# Patient Record
Sex: Female | Born: 1980 | Race: Black or African American | Hispanic: No | Marital: Married | Smoking: Never smoker
Health system: Southern US, Community
[De-identification: ages and names within clinical notes are randomized; demographics above are authoritative.]

## PROBLEM LIST (undated history)

## (undated) ENCOUNTER — Inpatient Hospital Stay (HOSPITAL_COMMUNITY): Payer: Self-pay

## (undated) DIAGNOSIS — D219 Benign neoplasm of connective and other soft tissue, unspecified: Secondary | ICD-10-CM

## (undated) DIAGNOSIS — N856 Intrauterine synechiae: Secondary | ICD-10-CM

## (undated) DIAGNOSIS — E785 Hyperlipidemia, unspecified: Secondary | ICD-10-CM

## (undated) DIAGNOSIS — L309 Dermatitis, unspecified: Secondary | ICD-10-CM

## (undated) DIAGNOSIS — Z973 Presence of spectacles and contact lenses: Secondary | ICD-10-CM

## (undated) DIAGNOSIS — F419 Anxiety disorder, unspecified: Secondary | ICD-10-CM

## (undated) HISTORY — DX: Hyperlipidemia, unspecified: E78.5

## (undated) HISTORY — DX: Dermatitis, unspecified: L30.9

---

## 2011-09-07 HISTORY — PX: OTHER SURGICAL HISTORY: SHX169

## 2012-08-06 LAB — HM PAP SMEAR

## 2013-02-01 ENCOUNTER — Other Ambulatory Visit (HOSPITAL_COMMUNITY): Payer: Self-pay | Admitting: Obstetrics and Gynecology

## 2013-02-01 DIAGNOSIS — Z319 Encounter for procreative management, unspecified: Secondary | ICD-10-CM

## 2013-02-09 ENCOUNTER — Ambulatory Visit (HOSPITAL_COMMUNITY)
Admission: RE | Admit: 2013-02-09 | Discharge: 2013-02-09 | Disposition: A | Discharge status: Home / Self Care | Payer: Federal, State, Local not specified - PPO | Source: Ambulatory Visit | Attending: Obstetrics and Gynecology | Admitting: Obstetrics and Gynecology

## 2013-02-09 DIAGNOSIS — Z319 Encounter for procreative management, unspecified: Secondary | ICD-10-CM

## 2013-02-09 DIAGNOSIS — N979 Female infertility, unspecified: Secondary | ICD-10-CM | POA: Insufficient documentation

## 2013-02-09 MED ORDER — IOHEXOL 300 MG/ML  SOLN
20.0000 mL | Freq: Once | INTRAMUSCULAR | Status: AC | PRN
  Administered 2013-02-09: 20 mL

## 2013-10-12 ENCOUNTER — Ambulatory Visit (INDEPENDENT_AMBULATORY_CARE_PROVIDER_SITE_OTHER): Payer: Federal, State, Local not specified - PPO | Admitting: Family Medicine

## 2013-10-12 ENCOUNTER — Encounter: Payer: Self-pay | Admitting: Family Medicine

## 2013-10-12 ENCOUNTER — Encounter (INDEPENDENT_AMBULATORY_CARE_PROVIDER_SITE_OTHER): Payer: Self-pay

## 2013-10-12 VITALS — BP 115/70 | HR 87 | Resp 16 | Ht 65.0 in | Wt 182.0 lb

## 2013-10-12 DIAGNOSIS — R5381 Other malaise: Secondary | ICD-10-CM

## 2013-10-12 DIAGNOSIS — E349 Endocrine disorder, unspecified: Secondary | ICD-10-CM

## 2013-10-12 DIAGNOSIS — R7301 Impaired fasting glucose: Secondary | ICD-10-CM

## 2013-10-12 DIAGNOSIS — R7989 Other specified abnormal findings of blood chemistry: Secondary | ICD-10-CM

## 2013-10-12 DIAGNOSIS — R5383 Other fatigue: Secondary | ICD-10-CM

## 2013-10-12 DIAGNOSIS — K219 Gastro-esophageal reflux disease without esophagitis: Secondary | ICD-10-CM

## 2013-10-12 DIAGNOSIS — E785 Hyperlipidemia, unspecified: Secondary | ICD-10-CM

## 2013-10-12 MED ORDER — PANTOPRAZOLE SODIUM 40 MG PO TBEC: 40.0000 mg | DELAYED_RELEASE_TABLET | Freq: Every day | ORAL | Status: DC

## 2013-10-12 NOTE — Progress Notes (Signed)
Subjective:    Patient ID: Nicole Wiggins, female    DOB: 11-Oct-1980, 33 y.o.   MRN: [id number]  HPI  Nicole Wiggins is here today to establish care with our practice.  She found our practice online.  She moved to [place] back in 2012 but has not had a PCP since moving here.  She would like to discuss the conditions listed below:   1)  Infertility - She and her husband have been trying to become pregnant for the past year. She is scheduled to see a fertility specialist in March.  She wonders if there are any labs that she should have before that visit.     2)  Chest Discomfort - She has been having a mild chest discomfort for the past month.  She notices this problem usually when she eats.  She has not tried any OTC medications for this symptom.    3)  Hyperlipidemia:  She has been told by other providers that she has elevated cholesterol levels.  She has tried improving her diet and exercising and would like to have lab-work done.    Review of Systems  Constitutional: Negative for activity change, fatigue and unexpected weight change.  HENT: Negative.   Eyes: Negative.   Respiratory: Negative for shortness of breath.   Cardiovascular: Positive for chest pain. Negative for palpitations and leg swelling.  Gastrointestinal: Negative for diarrhea and constipation.  Endocrine: Negative.   Genitourinary: Negative for difficulty urinating.  Musculoskeletal: Negative.   Skin: Negative.   Neurological: Negative.   Hematological: Negative for adenopathy. Does not bruise/bleed easily.  Psychiatric/Behavioral: Negative for sleep disturbance and dysphoric mood. The patient is not nervous/anxious.      Past Medical History  Diagnosis Date  . Fibroids, submucosal   . Eczema     Neck, behind ears and scalp     Past Surgical History  Procedure Laterality Date  . 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 Adopted: Yes     No Known Allergies   Immunization History  Administered Date(s) Administered  . Tdap 09/06/2010        Objective:   Physical Exam  Nursing note and vitals reviewed. Constitutional: She is oriented to person, place, and time.  Eyes: Conjunctivae are normal. No scleral icterus.  Neck: Neck supple. No thyromegaly present.  Cardiovascular: Normal rate, regular rhythm and normal heart sounds.   Pulmonary/Chest: Effort normal and breath sounds normal.  Musculoskeletal: She exhibits no edema and no tenderness.  Lymphadenopathy:    She has no cervical adenopathy.  Neurological: She is alert and oriented to person, place, and time.  Skin: Skin is warm and dry.  Psychiatric: She has a normal mood and affect. Her behavior is normal. Judgment and thought content normal.      Assessment &  Plan:    Nicole Wiggins was seen today for establish care.  Diagnoses and associated orders for this visit:  Elevated testosterone level in female - Testosterone  Impaired fasting glucose - COMPLETE METABOLIC PANEL WITH GFR - Insulin, fasting  Other and unspecified hyperlipidemia - Lipid panel  Other malaise and fatigue - CBC w/Diff - TSH  GERD (gastroesophageal reflux disease) - Discontinue: pantoprazole (PROTONIX) 40 MG tablet; Take 1 tablet (40 mg total) by mouth daily.

## 2013-10-12 NOTE — Patient Instructions (Signed)
1)  Infertility - We are checking labs; Recipe for Boy.  2)  Chest Pain - Take the pantoprazole daily.    3)  Return for CPE in 2 weeks for a recheck and to go over labs.    Gastroesophageal Reflux Disease, Adult Gastroesophageal reflux disease (GERD) happens when acid from your stomach flows up into the esophagus. When acid comes in contact with the esophagus, the acid causes soreness (inflammation) in the esophagus. Over time, GERD may create small holes (ulcers) in the lining of the esophagus. CAUSES   Increased body weight. This puts pressure on the stomach, making acid rise from the stomach into the esophagus.  Smoking. This increases acid production in the stomach.  Drinking alcohol. This causes decreased pressure in the lower esophageal sphincter (valve or ring of muscle between the esophagus and stomach), allowing acid from the stomach into the esophagus.  Late evening meals and a full stomach. This increases pressure and acid production in the stomach.  A malformed lower esophageal sphincter. Sometimes, no cause is found. SYMPTOMS   Burning pain in the lower part of the mid-chest behind the breastbone and in the mid-stomach area. This may occur twice a week or more often.  Trouble swallowing.  Sore throat.  Dry cough.  Asthma-like symptoms including chest tightness, shortness of breath, or wheezing. DIAGNOSIS  Your caregiver may be able to diagnose GERD based on your symptoms. In some cases, X-rays and other tests may be done to check for complications or to check the condition of your stomach and esophagus. TREATMENT  Your caregiver may recommend over-the-counter or prescription medicines to help decrease acid production. Ask your caregiver before starting or adding any new medicines.  HOME CARE INSTRUCTIONS   Change the factors that you can control. Ask your caregiver for guidance concerning weight loss, quitting smoking, and alcohol consumption.  Avoid foods and  drinks that make your symptoms worse, such as:  Caffeine or alcoholic drinks.  Chocolate.  Peppermint or mint flavorings.  Garlic and onions.  Spicy foods.  Citrus fruits, such as oranges, lemons, or limes.  Tomato-based foods such as sauce, chili, salsa, and pizza.  Fried and fatty foods.  Avoid lying down for the 3 hours prior to your bedtime or prior to taking a nap.  Eat small, frequent meals instead of large meals.  Wear loose-fitting clothing. Do not wear anything tight around your waist that causes pressure on your stomach.  Raise the head of your bed 6 to 8 inches with wood blocks to help you sleep. Extra pillows will not help.  Only take over-the-counter or prescription medicines for pain, discomfort, or fever as directed by your caregiver.  Do not take aspirin, ibuprofen, or other nonsteroidal anti-inflammatory drugs (NSAIDs). SEEK IMMEDIATE MEDICAL CARE IF:   You have pain in your arms, neck, jaw, teeth, or back.  Your pain increases or changes in intensity or duration.  You develop nausea, vomiting, or sweating (diaphoresis).  You develop shortness of breath, or you faint.  Your vomit is green, yellow, black, or looks like coffee grounds or blood.  Your stool is red, bloody, or black. These symptoms could be signs of other problems, such as heart disease, gastric bleeding, or esophageal bleeding. MAKE SURE YOU:   Understand these instructions.  Will watch your condition.  Will get help right away if you are not doing well or get worse. Document Released: 06/02/2005 Document Revised: 11/15/2011 Document Reviewed: 03/12/2011 [facility] Patient Information 2014 [place], [place].  Diet  for Gastroesophageal Reflux Disease, Adult Reflux (acid reflux) is when acid from your stomach flows up into the esophagus. When acid comes in contact with the esophagus, the acid causes irritation and soreness (inflammation) in the esophagus. When reflux happens often or so  severely that it causes damage to the esophagus, it is called gastroesophageal reflux disease (GERD). Nutrition therapy can help ease the discomfort of GERD. FOODS OR DRINKS TO AVOID OR LIMIT  Smoking or chewing tobacco. Nicotine is one of the most potent stimulants to acid production in the gastrointestinal tract.  Caffeinated and decaffeinated coffee and black tea.  Regular or low-calorie carbonated beverages or energy drinks (caffeine-free carbonated beverages are allowed).   Strong spices, such as black pepper, white pepper, red pepper, cayenne, curry powder, and chili powder.  Peppermint or spearmint.  Chocolate.  High-fat foods, including meats and fried foods. Extra added fats including oils, butter, salad dressings, and nuts. Limit these to less than 8 tsp per day.  Fruits and vegetables if they are not tolerated, such as citrus fruits or tomatoes.  Alcohol.  Any food that seems to aggravate your condition. If you have questions regarding your diet, call your caregiver or a registered dietitian. OTHER THINGS THAT MAY HELP GERD INCLUDE:   Eating your meals slowly, in a relaxed setting.  Eating 5 to 6 small meals per day instead of 3 large meals.  Eliminating food for a period of time if it causes distress.  Not lying down until 3 hours after eating a meal.  Keeping the head of your bed raised 6 to 9 inches (15 to 23 cm) by using a foam wedge or blocks under the legs of the bed. Lying flat may make symptoms worse.  Being physically active. Weight loss may be helpful in reducing reflux in overweight or obese adults.  Wear loose fitting clothing EXAMPLE MEAL PLAN This meal plan is approximately 2,000 calories based on [email or web address] meal planning guidelines. Breakfast   cup cooked oatmeal.  1 cup strawberries.  1 cup low-fat milk.  1 oz almonds. Snack  1 cup cucumber slices.  6 oz yogurt (made from low-fat or fat-free milk). Lunch  2 slice whole-wheat  bread.  2 oz sliced [place].  2 tsp mayonnaise.  1 cup blueberries.  1 cup snap peas. Snack  6 whole-wheat crackers.  1 oz string cheese. Dinner   cup brown rice.  1 cup mixed veggies.  1 tsp olive oil.  3 oz grilled fish. Document Released: 08/23/2005 Document Revised: 11/15/2011 Document Reviewed: 07/09/2011 [facility] Patient Information 2014 [place], [place].

## 2013-10-16 LAB — CBC WITH DIFFERENTIAL/PLATELET
Basophils Absolute: 0 10*3/uL (ref 0.0–0.1)
Basophils Relative: 0 % (ref 0–1)
Eosinophils Absolute: 0.1 10*3/uL (ref 0.0–0.7)
Eosinophils Relative: 1 % (ref 0–5)
HCT: 38.3 % (ref 36.0–46.0)
Hemoglobin: 12.2 g/dL (ref 12.0–15.0)
Lymphocytes Relative: 26 % (ref 12–46)
Lymphs Abs: 2 10*3/uL (ref 0.7–4.0)
MCH: 27.4 pg (ref 26.0–34.0)
MCHC: 31.9 g/dL (ref 30.0–36.0)
MCV: 86.1 fL (ref 78.0–100.0)
Monocytes Absolute: 0.5 10*3/uL (ref 0.1–1.0)
Monocytes Relative: 7 % (ref 3–12)
Neutro Abs: 5.1 10*3/uL (ref 1.7–7.7)
Neutrophils Relative %: 66 % (ref 43–77)
Platelets: 330 10*3/uL (ref 150–400)
RBC: 4.45 MIL/uL (ref 3.87–5.11)
RDW: 14.8 % (ref 11.5–15.5)
WBC: 7.8 10*3/uL (ref 4.0–10.5)

## 2013-10-16 LAB — INSULIN, FASTING: Insulin fasting, serum: 12 u[IU]/mL (ref 3–28)

## 2013-10-16 LAB — LIPID PANEL
Cholesterol: 221 mg/dL — ABNORMAL HIGH (ref 0–200)
HDL: 89 mg/dL (ref >39)
LDL Cholesterol: 117 mg/dL — ABNORMAL HIGH (ref 0–99)
Total CHOL/HDL Ratio: 2.5 Ratio
Triglycerides: 73 mg/dL (ref <150)
VLDL: 15 mg/dL (ref 0–40)

## 2013-10-16 LAB — COMPLETE METABOLIC PANEL WITH GFR
ALT: 21 U/L (ref 0–35)
AST: 24 U/L (ref 0–37)
Albumin: 4.6 g/dL (ref 3.5–5.2)
Alkaline Phosphatase: 53 U/L (ref 39–117)
BUN: 13 mg/dL (ref 6–23)
CO2: 24 mEq/L (ref 19–32)
Calcium: 9.7 mg/dL (ref 8.4–10.5)
Chloride: 100 mEq/L (ref 96–112)
Creat: 0.85 mg/dL (ref 0.50–1.10)
GFR, Est African American: >89 mL/min
GFR, Est Non African American: 89 mL/min
Glucose, Bld: 75 mg/dL (ref 70–99)
Potassium: 4.3 mEq/L (ref 3.5–5.3)
Sodium: 135 mEq/L (ref 135–145)
Total Bilirubin: 0.4 mg/dL (ref 0.2–1.2)
Total Protein: 7.8 g/dL (ref 6.0–8.3)

## 2013-10-16 LAB — TESTOSTERONE: Testosterone: 64 ng/dL (ref 10–70)

## 2013-10-16 LAB — TSH: TSH: 1.592 u[IU]/mL (ref 0.350–4.500)

## 2013-11-07 ENCOUNTER — Ambulatory Visit (INDEPENDENT_AMBULATORY_CARE_PROVIDER_SITE_OTHER): Payer: Federal, State, Local not specified - PPO | Admitting: Family Medicine

## 2013-11-07 ENCOUNTER — Encounter: Payer: Self-pay | Admitting: Family Medicine

## 2013-11-07 VITALS — BP 126/86 | HR 80 | Resp 16 | Ht 65.0 in | Wt 180.0 lb

## 2013-11-07 DIAGNOSIS — Z0000 Encounter for general adult medical examination without abnormal findings: Secondary | ICD-10-CM

## 2013-11-07 LAB — POCT URINALYSIS DIPSTICK
Bilirubin, UA: NEGATIVE
Blood, UA: NEGATIVE
Glucose, UA: NEGATIVE
Ketones, UA: NEGATIVE
Leukocytes, UA: NEGATIVE
Nitrite, UA: NEGATIVE
Protein, UA: NEGATIVE
Spec Grav, UA: 1.02
Urobilinogen, UA: NEGATIVE
pH, UA: 5

## 2013-11-07 NOTE — Patient Instructions (Addendum)
1)  Pap ? - HPV (High Risk - 16/18)  2)  Actos 15 mg vs Metformin 500   Pap Test A Pap test checks the cells on the surface of your cervix. Your doctor will look for cell changes that are not normal, an infection, or cancer. If the cells no longer look normal, it is called dysplasia. Dysplasia can turn into cancer. Regular Pap tests are important to stop cancer from developing. BEFORE THE PROCEDURE  Ask your doctor when to schedule your Pap test. Timing the test around your period may be important.  Do not douche or have sex (intercourse) for 24 hours before the test.  Do not put creams on your vagina or use tampons for 24 hours before the test.  Go pee (urinate) just before the test. PROCEDURE  You will lie on an exam table with your feet in stirrups.  A warm metal or plastic tool (speculum) will be put in your vagina to open it up.  Your doctor will use a small, plastic brush or wooden spatula to take cells from your cervix.  The cells will be put in a lab container.  The cells will be checked under a microscope to see if they are normal or not. AFTER THE PROCEDURE Get your test results. If they are abnormal, you may need more tests. Document Released: 09/25/2010 Document Revised: 11/15/2011 Document Reviewed: 08/19/2011 [facility] Patient Information 2014 [place], [place].

## 2013-11-07 NOTE — Progress Notes (Signed)
Subjective:    Patient ID: Nicole Wiggins, female    DOB: 12-22-1980, 33 y.o.   MRN: [id number]  HPI  Nicole Wiggins is here today for her annual CPE.  We are going over her recent lab results today. Overall she feels that she is healthy and has no medical complaints today.   Review of Systems  Constitutional: Negative for activity change, appetite change and unexpected weight change.  Respiratory: Negative.   Cardiovascular: Negative.  Negative for chest pain, palpitations and leg swelling.  Endocrine: Negative.   Genitourinary: Negative.   Musculoskeletal: Negative.   Skin:       Acne on face  Neurological: Negative.   Psychiatric/Behavioral: Negative.   All other systems reviewed and are negative.    Past Medical History  Diagnosis Date  . Fibroids, submucosal   . Eczema     Neck, behind ears and scalp     Past Surgical History  Procedure Laterality Date  . 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 Adopted: Yes     Current Outpatient Prescriptions on File Prior to Visit  Medication Sig Dispense Refill  . ELIDEL 1 % cream        No current facility-administered medications on file prior to visit.     No Known Allergies   Immunization History  Administered Date(s) Administered  . Tdap 09/06/2010        Objective:   Physical Exam  Nursing note and vitals reviewed. Constitutional: She is oriented to person, place, and time. She appears well-developed and well-nourished. No distress.  HENT:  Head:  Normocephalic and atraumatic.  Right Ear: External ear normal.  Left Ear: External ear normal.  Nose: Nose normal.  Mouth/Throat: Oropharynx is clear and moist.  Eyes: Conjunctivae and EOM are normal. Pupils are equal, round, and reactive to light. Right eye exhibits no discharge. Left eye exhibits no discharge. No scleral icterus.  Neck: Normal range of motion. Neck supple. No thyromegaly present.  Cardiovascular: Normal rate, regular rhythm, normal heart sounds and intact distal pulses.  Exam reveals no gallop and no friction rub.   No murmur heard. Pulmonary/Chest: Effort normal and breath sounds normal. Right breast exhibits no inverted nipple, no mass, no nipple discharge, no skin change and no tenderness. Left breast exhibits no inverted nipple, no mass, no nipple discharge, no skin change and no tenderness. Breasts are symmetrical.  Abdominal: Soft. Bowel sounds are normal. She exhibits no distension and no mass. There is no tenderness.  Musculoskeletal: Normal range of motion. She exhibits no edema and no tenderness.  Lymphadenopathy:    She has no cervical adenopathy.  Neurological: She is alert and oriented to person, place, and time. She has normal reflexes.  Skin: Skin is warm and dry. No rash noted.  Acne on face  Psychiatric: She has a normal mood and affect. Her behavior is normal. Judgment and thought content normal.      Assessment & Plan:    [name] was seen today for annual exam.  Diagnoses and associated orders for this visit:  Routine general medical examination at a health care facility - POCT urinalysis dipstick

## 2013-11-09 ENCOUNTER — Encounter: Payer: Self-pay | Admitting: *Deleted

## 2013-12-16 DIAGNOSIS — R5383 Other fatigue: Secondary | ICD-10-CM

## 2013-12-16 DIAGNOSIS — R7989 Other specified abnormal findings of blood chemistry: Secondary | ICD-10-CM | POA: Insufficient documentation

## 2013-12-16 DIAGNOSIS — E785 Hyperlipidemia, unspecified: Secondary | ICD-10-CM | POA: Insufficient documentation

## 2013-12-16 DIAGNOSIS — R7301 Impaired fasting glucose: Secondary | ICD-10-CM | POA: Insufficient documentation

## 2013-12-16 DIAGNOSIS — R5381 Other malaise: Secondary | ICD-10-CM | POA: Insufficient documentation

## 2013-12-16 DIAGNOSIS — K219 Gastro-esophageal reflux disease without esophagitis: Secondary | ICD-10-CM | POA: Insufficient documentation

## 2014-06-28 ENCOUNTER — Encounter (HOSPITAL_BASED_OUTPATIENT_CLINIC_OR_DEPARTMENT_OTHER): Payer: Self-pay | Admitting: *Deleted

## 2014-07-01 ENCOUNTER — Encounter (HOSPITAL_BASED_OUTPATIENT_CLINIC_OR_DEPARTMENT_OTHER): Payer: Self-pay | Admitting: *Deleted

## 2014-07-01 NOTE — Progress Notes (Signed)
NPO AFTER MN. ARRIVE AT 1045. NEEDS HG AND URINE PREG.  

## 2014-07-04 ENCOUNTER — Encounter (HOSPITAL_BASED_OUTPATIENT_CLINIC_OR_DEPARTMENT_OTHER): Payer: Self-pay

## 2014-07-04 ENCOUNTER — Ambulatory Visit (HOSPITAL_BASED_OUTPATIENT_CLINIC_OR_DEPARTMENT_OTHER): Payer: Federal, State, Local not specified - PPO | Admitting: Anesthesiology

## 2014-07-04 ENCOUNTER — Encounter (HOSPITAL_BASED_OUTPATIENT_CLINIC_OR_DEPARTMENT_OTHER): Payer: Federal, State, Local not specified - PPO | Admitting: Anesthesiology

## 2014-07-04 ENCOUNTER — Encounter (HOSPITAL_BASED_OUTPATIENT_CLINIC_OR_DEPARTMENT_OTHER)
Admission: RE | Disposition: A | Discharge status: Home / Self Care | Payer: Self-pay | Source: Ambulatory Visit | Attending: Obstetrics and Gynecology

## 2014-07-04 ENCOUNTER — Ambulatory Visit (HOSPITAL_BASED_OUTPATIENT_CLINIC_OR_DEPARTMENT_OTHER)
Admission: RE | Admit: 2014-07-04 | Discharge: 2014-07-04 | Disposition: A | Discharge status: Home / Self Care | Payer: Federal, State, Local not specified - PPO | Source: Ambulatory Visit | Attending: Obstetrics and Gynecology | Admitting: Obstetrics and Gynecology

## 2014-07-04 DIAGNOSIS — N856 Intrauterine synechiae: Secondary | ICD-10-CM | POA: Insufficient documentation

## 2014-07-04 DIAGNOSIS — D251 Intramural leiomyoma of uterus: Secondary | ICD-10-CM | POA: Insufficient documentation

## 2014-07-04 DIAGNOSIS — D252 Subserosal leiomyoma of uterus: Secondary | ICD-10-CM | POA: Insufficient documentation

## 2014-07-04 DIAGNOSIS — L309 Dermatitis, unspecified: Secondary | ICD-10-CM | POA: Insufficient documentation

## 2014-07-04 DIAGNOSIS — N736 Female pelvic peritoneal adhesions (postinfective): Secondary | ICD-10-CM | POA: Diagnosis present

## 2014-07-04 HISTORY — DX: Intrauterine synechiae: N85.6

## 2014-07-04 HISTORY — PX: LAPAROSCOPY: SHX197

## 2014-07-04 HISTORY — DX: Presence of spectacles and contact lenses: Z97.3

## 2014-07-04 HISTORY — PX: HYSTEROSCOPY: SHX211

## 2014-07-04 LAB — POCT PREGNANCY, URINE: PREG TEST UR: NEGATIVE

## 2014-07-04 LAB — POCT HEMOGLOBIN-HEMACUE: HEMOGLOBIN: 10.4 g/dL — AB (ref 12.0–15.0)

## 2014-07-04 SURGERY — HYSTEROSCOPY
Anesthesia: General | Site: Vagina

## 2014-07-04 MED ORDER — ESTRADIOL 2 MG PO TABS: 4.0000 mg | ORAL_TABLET | Freq: Two times a day (BID) | ORAL | Status: DC

## 2014-07-04 MED ORDER — ACETAMINOPHEN 10 MG/ML IV SOLN
INTRAVENOUS | Status: DC | PRN
  Administered 2014-07-04: 1000 mg via INTRAVENOUS

## 2014-07-04 MED ORDER — ONDANSETRON HCL 4 MG PO TABS: 4.0000 mg | ORAL_TABLET | Freq: Three times a day (TID) | ORAL | Status: DC | PRN

## 2014-07-04 MED ORDER — SODIUM CHLORIDE 0.9 % IR SOLN
Status: DC | PRN
  Administered 2014-07-04: 6000 mL

## 2014-07-04 MED ORDER — DEXAMETHASONE SODIUM PHOSPHATE 4 MG/ML IJ SOLN
INTRAMUSCULAR | Status: DC | PRN
  Administered 2014-07-04: 10 mg via INTRAVENOUS

## 2014-07-04 MED ORDER — NEOSTIGMINE METHYLSULFATE 10 MG/10ML IV SOLN
INTRAVENOUS | Status: DC | PRN
  Administered 2014-07-04: 3 mg via INTRAVENOUS

## 2014-07-04 MED ORDER — LIDOCAINE HCL (CARDIAC) 20 MG/ML IV SOLN
INTRAVENOUS | Status: DC | PRN
  Administered 2014-07-04: 50 mg via INTRAVENOUS

## 2014-07-04 MED ORDER — MIDAZOLAM HCL 2 MG/2ML IJ SOLN
INTRAMUSCULAR | Status: AC
  Filled 2014-07-04: qty 2

## 2014-07-04 MED ORDER — FENTANYL CITRATE 0.05 MG/ML IJ SOLN
25.0000 ug | INTRAMUSCULAR | Status: DC | PRN
  Administered 2014-07-04 (×3): 50 ug via INTRAVENOUS
  Filled 2014-07-04: qty 1

## 2014-07-04 MED ORDER — OXYCODONE-ACETAMINOPHEN 7.5-325 MG PO TABS: 1.0000 | ORAL_TABLET | ORAL | Status: DC | PRN

## 2014-07-04 MED ORDER — ROCURONIUM BROMIDE 100 MG/10ML IV SOLN
INTRAVENOUS | Status: DC | PRN
  Administered 2014-07-04: 30 mg via INTRAVENOUS
  Administered 2014-07-04 (×2): 10 mg via INTRAVENOUS

## 2014-07-04 MED ORDER — METOCLOPRAMIDE HCL 5 MG/ML IJ SOLN
INTRAMUSCULAR | Status: DC | PRN
  Administered 2014-07-04: 10 mg via INTRAVENOUS

## 2014-07-04 MED ORDER — FENTANYL CITRATE 0.05 MG/ML IJ SOLN
INTRAMUSCULAR | Status: AC
  Filled 2014-07-04: qty 2

## 2014-07-04 MED ORDER — PROPOFOL 10 MG/ML IV BOLUS
INTRAVENOUS | Status: DC | PRN
  Administered 2014-07-04: 200 mg via INTRAVENOUS

## 2014-07-04 MED ORDER — KETOROLAC TROMETHAMINE 30 MG/ML IJ SOLN
INTRAMUSCULAR | Status: DC | PRN
  Administered 2014-07-04: 30 mg via INTRAVENOUS

## 2014-07-04 MED ORDER — GLYCOPYRROLATE 0.2 MG/ML IJ SOLN
INTRAMUSCULAR | Status: DC | PRN
  Administered 2014-07-04: 0.6 mg via INTRAVENOUS

## 2014-07-04 MED ORDER — PROMETHAZINE HCL 25 MG/ML IJ SOLN
INTRAMUSCULAR | Status: AC
Start: 2014-07-04 — End: 2014-07-04
  Filled 2014-07-04: qty 1

## 2014-07-04 MED ORDER — VASOPRESSIN 20 UNIT/ML IV SOLN
INTRAVENOUS | Status: DC | PRN
  Administered 2014-07-04: 14:00:00 via INTRAMUSCULAR

## 2014-07-04 MED ORDER — BUPIVACAINE-EPINEPHRINE 0.25% -1:200000 IJ SOLN
INTRAMUSCULAR | Status: DC | PRN
  Administered 2014-07-04: 2 mL

## 2014-07-04 MED ORDER — METHYLENE BLUE 1 % INJ SOLN
INTRAMUSCULAR | Status: DC | PRN
  Administered 2014-07-04: 1 mL via SUBMUCOSAL

## 2014-07-04 MED ORDER — FENTANYL CITRATE 0.05 MG/ML IJ SOLN
INTRAMUSCULAR | Status: AC
  Filled 2014-07-04: qty 6

## 2014-07-04 MED ORDER — CEFAZOLIN SODIUM-DEXTROSE 2-3 GM-% IV SOLR
INTRAVENOUS | Status: AC
  Filled 2014-07-04: qty 50

## 2014-07-04 MED ORDER — FENTANYL CITRATE 0.05 MG/ML IJ SOLN
INTRAMUSCULAR | Status: DC | PRN
  Administered 2014-07-04 (×4): 50 ug via INTRAVENOUS

## 2014-07-04 MED ORDER — OXYCODONE-ACETAMINOPHEN 5-325 MG PO TABS
1.0000 | ORAL_TABLET | Freq: Four times a day (QID) | ORAL | Status: DC | PRN
  Administered 2014-07-04: 1 via ORAL
  Filled 2014-07-04: qty 1

## 2014-07-04 MED ORDER — MIDAZOLAM HCL 5 MG/5ML IJ SOLN
INTRAMUSCULAR | Status: DC | PRN
  Administered 2014-07-04: 2 mg via INTRAVENOUS

## 2014-07-04 MED ORDER — CEFAZOLIN SODIUM-DEXTROSE 2-3 GM-% IV SOLR
2.0000 g | INTRAVENOUS | Status: AC
  Administered 2014-07-04: 2 g via INTRAVENOUS
  Filled 2014-07-04: qty 50

## 2014-07-04 MED ORDER — OXYCODONE-ACETAMINOPHEN 5-325 MG PO TABS
ORAL_TABLET | ORAL | Status: AC
  Filled 2014-07-04: qty 1

## 2014-07-04 MED ORDER — LACTATED RINGERS IR SOLN
Status: DC | PRN
  Administered 2014-07-04: 3000 mL

## 2014-07-04 MED ORDER — IBUPROFEN 800 MG PO TABS: 800.0000 mg | ORAL_TABLET | Freq: Three times a day (TID) | ORAL | Status: DC | PRN

## 2014-07-04 MED ORDER — PROMETHAZINE HCL 25 MG/ML IJ SOLN
6.2500 mg | INTRAMUSCULAR | Status: DC | PRN
  Administered 2014-07-04: 6.25 mg via INTRAVENOUS
  Filled 2014-07-04: qty 1

## 2014-07-04 MED ORDER — LACTATED RINGERS IV SOLN
INTRAVENOUS | Status: DC
  Administered 2014-07-04 (×2): via INTRAVENOUS
  Filled 2014-07-04: qty 1000

## 2014-07-04 SURGICAL SUPPLY — 93 items
APPLICATOR COTTON TIP 6IN STRL (MISCELLANEOUS) ×3 IMPLANT
BAG URINE DRAINAGE (UROLOGICAL SUPPLIES) ×3 IMPLANT
BLADE SURG 15 STRL LF DISP TIS (BLADE) ×2 IMPLANT
BLADE SURG 15 STRL SS (BLADE) ×1
CANISTER SUCTION 2500CC (MISCELLANEOUS) ×3 IMPLANT
CANNULA CURETTE W/SYR 6 (CANNULA) IMPLANT
CANNULA CURETTE W/SYR 7 (CANNULA) ×3 IMPLANT
CATH FOLEY 2WAY SLVR  5CC 14FR (CATHETERS)
CATH FOLEY 2WAY SLVR 5CC 14FR (CATHETERS) IMPLANT
CATH ROBINSON RED A/P 16FR (CATHETERS) ×3 IMPLANT
CORD ACTIVE DISPOSABLE (ELECTRODE) ×1
CORD ELECTRO ACTIVE DISP (ELECTRODE) ×2 IMPLANT
COVER MAYO STAND STRL (DRAPES) ×3 IMPLANT
COVER TABLE BACK 60X90 (DRAPES) ×3 IMPLANT
DERMABOND ADVANCED (GAUZE/BANDAGES/DRESSINGS) ×1
DERMABOND ADVANCED .7 DNX12 (GAUZE/BANDAGES/DRESSINGS) ×2 IMPLANT
DEVICE TROCAR PUNCTURE CLOSURE (ENDOMECHANICALS) IMPLANT
DRAPE CAMERA CLOSED 9X96 (DRAPES) ×3 IMPLANT
DRAPE LG THREE QUARTER DISP (DRAPES) ×6 IMPLANT
DRAPE UNDERBUTTOCKS STRL (DRAPE) ×3 IMPLANT
DRSG OPSITE POSTOP 3X4 (GAUZE/BANDAGES/DRESSINGS) IMPLANT
DRSG TELFA 3X8 NADH (GAUZE/BANDAGES/DRESSINGS) ×3 IMPLANT
ELECT LOOP GYNE PRO 24FR (CUTTING LOOP)
ELECT NEEDLE TIP 2.8 STRL (NEEDLE) IMPLANT
ELECT REM PT RETURN 9FT ADLT (ELECTROSURGICAL) ×3
ELECT VAPORTRODE GRVD BAR (ELECTRODE) IMPLANT
ELECTRODE KNIFE SHAPED 22FR (ELECTROSURGICAL) IMPLANT
ELECTRODE LOOP GYNE PRO 24FR (CUTTING LOOP) IMPLANT
ELECTRODE REM PT RTRN 9FT ADLT (ELECTROSURGICAL) ×2 IMPLANT
EVACUATOR SMOKE 8.L (FILTER) IMPLANT
FILTER SMOKE EVAC LAPAROSHD (FILTER) ×3 IMPLANT
GLOVE BIO SURGEON STRL SZ8 (GLOVE) ×3 IMPLANT
GLOVE BIOGEL M 6.5 STRL (GLOVE) ×3 IMPLANT
GLOVE BIOGEL PI IND STRL 6.5 (GLOVE) ×2 IMPLANT
GLOVE BIOGEL PI IND STRL 7.5 (GLOVE) ×2 IMPLANT
GLOVE BIOGEL PI IND STRL 8.5 (GLOVE) ×2 IMPLANT
GLOVE BIOGEL PI INDICATOR 6.5 (GLOVE) ×1
GLOVE BIOGEL PI INDICATOR 7.5 (GLOVE) ×1
GLOVE BIOGEL PI INDICATOR 8.5 (GLOVE) ×1
GOWN PREVENTION PLUS LG XLONG (DISPOSABLE) IMPLANT
GOWN STRL REIN XL XLG (GOWN DISPOSABLE) ×6 IMPLANT
GOWN STRL REUS W/TWL LRG LVL3 (GOWN DISPOSABLE) ×3 IMPLANT
HOLDER FOLEY CATH W/STRAP (MISCELLANEOUS) ×6 IMPLANT
IV NS IRRIG 3000ML ARTHROMATIC (IV SOLUTION) ×6 IMPLANT
LEGGING LITHOTOMY PAIR STRL (DRAPES) ×3 IMPLANT
LOOP ANGLED CUTTING 22FR (CUTTING LOOP) IMPLANT
MANIPULATOR UTERINE 4.5 ZUMI (MISCELLANEOUS) ×3 IMPLANT
NEEDLE HYPO 25X1 1.5 SAFETY (NEEDLE) ×3 IMPLANT
NEEDLE INSUFFLATION 14GA 120MM (NEEDLE) ×3 IMPLANT
NS IRRIG 500ML POUR BTL (IV SOLUTION) ×3 IMPLANT
PACK BASIN DAY SURGERY FS (CUSTOM PROCEDURE TRAY) ×3 IMPLANT
PACK LAPAROSCOPY II (CUSTOM PROCEDURE TRAY) ×3 IMPLANT
PAD OB MATERNITY 4.3X12.25 (PERSONAL CARE ITEMS) ×3 IMPLANT
PENCIL BUTTON HOLSTER BLD 10FT (ELECTRODE) IMPLANT
POUCH SPECIMEN RETRIEVAL 10MM (ENDOMECHANICALS) IMPLANT
SCALPEL HARMONIC ACE (MISCELLANEOUS) IMPLANT
SEALER TISSUE G2 CVD JAW 35 (ENDOMECHANICALS) IMPLANT
SEALER TISSUE G2 CVD JAW 45CM (ENDOMECHANICALS) IMPLANT
SEPRAFILM MEMBRANE 5X6 (MISCELLANEOUS) ×6 IMPLANT
SET IRRIG TUBING LAPAROSCOPIC (IRRIGATION / IRRIGATOR) ×3 IMPLANT
SET IRRIG Y TYPE TUR BLADDER L (SET/KITS/TRAYS/PACK) ×3 IMPLANT
SET TUBING HYSTEROSCOPY 2 NDL (TUBING) ×3 IMPLANT
SOLUTION ANTI FOG 6CC (MISCELLANEOUS) ×3 IMPLANT
STENT BALLN UTERINE 3CM 6FR (Stent) IMPLANT
STENT BALLN UTERINE 4CM 6FR (STENTS) ×3 IMPLANT
SUT MNCRL AB 4-0 PS2 18 (SUTURE) ×3 IMPLANT
SUT PROLENE 0 CT 1 30 (SUTURE) IMPLANT
SUT SILK 2 0 30  PSL (SUTURE) ×1
SUT SILK 2 0 30 PSL (SUTURE) ×2 IMPLANT
SUT SILK 2 0 SH (SUTURE) IMPLANT
SUT SILK 3 0 PS 1 (SUTURE) IMPLANT
SUT VIC AB 2-0 CT1 27 (SUTURE)
SUT VIC AB 2-0 CT1 TAPERPNT 27 (SUTURE) IMPLANT
SUT VIC AB 2-0 UR6 27 (SUTURE) IMPLANT
SUT VICRYL 0 TIES 12 18 (SUTURE) IMPLANT
SYR 20CC LL (SYRINGE) IMPLANT
SYR 3ML 18GX1 1/2 (SYRINGE) ×3 IMPLANT
SYR 3ML 23GX1 SAFETY (SYRINGE) ×3 IMPLANT
SYR 50ML LL SCALE MARK (SYRINGE) ×6 IMPLANT
SYR 5ML LL (SYRINGE) ×3 IMPLANT
SYR CONTROL 10ML LL (SYRINGE) ×3 IMPLANT
SYRINGE 12CC LL (MISCELLANEOUS) ×3 IMPLANT
SYS LAPSCP GELPORT 120MM (MISCELLANEOUS)
SYSTEM LAPSCP GELPORT 120MM (MISCELLANEOUS) IMPLANT
TOWEL OR 17X24 6PK STRL BLUE (TOWEL DISPOSABLE) ×6 IMPLANT
TRAY DSU PREP LF (CUSTOM PROCEDURE TRAY) ×3 IMPLANT
TROCAR OPTI TIP 5M 100M (ENDOMECHANICALS) ×3 IMPLANT
TROCAR XCEL DIL TIP R 11M (ENDOMECHANICALS) IMPLANT
TUBE HYSTEROSCOPY W Y-CONNECT (TUBING) ×3 IMPLANT
TUBING INSUFFLATION 10FT LAP (TUBING) ×3 IMPLANT
WARMER LAPAROSCOPE (MISCELLANEOUS) ×3 IMPLANT
WATER STERILE IRR 500ML POUR (IV SOLUTION) ×3 IMPLANT
cook balloon uterine stent ×3 IMPLANT

## 2014-07-04 NOTE — H&P (Addendum)
Nicole Wiggins is a 33 y.o. female , originally referred to me by Dr. Servando Salina, for infertility. 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 I compared todays findings with the findings on HSG from 2014. 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 Dr Tillman Sers impression following SHG was that it was a normal variant.Her CtIgG is also positive at 1:256, raising the question of preexisting or interval infection and its consequences on the endosalpinges. Patient would like to preserve her childbearing potential.  Pertinent Gynecological History: Menses: normal  Bleeding: normal Contraception: none DES exposure: denies Blood transfusions: none Sexually transmitted diseases: CtIgG positive at 1:256 Previous GYN Procedures: Myomectomy  Last mammogram: normal Last pap: normal  OB History: Gravida 0   Menstrual History: Menarche age: 14 No LMP recorded.    Past Medical History  Diagnosis Date  . Eczema     Neck, behind ears and scalp  . Asherman's syndrome   . Wears contact lenses                     Past Surgical History  Procedure Laterality Date  . Dx laparoscopy/  laparotomy myomectomy  2013             Family History  Problem Relation Age of Onset  . Adopted: Yes   No hereditary disease.  No cancer of breast, ovary, uterus. No cutaneous leiomyomatosis or renal cell carcinoma.  History   Social History  . Marital Status: Married    Spouse Name: N/A    Number of Children: N/A  . Years of Education: N/A   Occupational History  . Not on file.   Social History Main Topics  . Smoking status: Never Smoker   . Smokeless tobacco: Never Used  . Alcohol Use: No  . Drug Use: No  . Sexual Activity: Yes   Other Topics Concern  . 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   No current outpatient prescriptions on file prior to encounter.     Review of Systems  Constitutional: Negative.   HENT: Negative.   Eyes: Negative.   Respiratory: Negative.   Cardiovascular: Negative.   Gastrointestinal: Negative.   Genitourinary: Negative.   Musculoskeletal: Negative.   Skin: Negative.   Neurological: Negative.   Endo/Heme/Allergies: Negative.   Psychiatric/Behavioral: Negative.      Physical Exam  Ht 5\' 5"  (1.651 m)  Wt 71.215 kg (157 lb)  BMI 26.13 kg/m2  LMP 06/10/2014 Constitutional: She is oriented to person, place, and time. She appears well-developed and well-nourished.  HENT:  Head: Normocephalic and atraumatic.  Nose: Nose normal.  Mouth/Throat: Oropharynx is clear and moist. No oropharyngeal exudate.  Eyes: Conjunctivae normal and EOM are  normal. Pupils are equal, round, and reactive to light. No scleral icterus.  Neck: Normal range of motion. Neck supple. No tracheal deviation present. No thyromegaly present.  Cardiovascular: Normal rate.   Respiratory: Effort normal and breath sounds normal.  GI: Soft. Bowel sounds are normal. She exhibits no distension and no mass. There is no tenderness.  Lymphadenopathy:    She has no cervical adenopathy.  Neurological: She is alert and oriented to person, place, and time. She has normal reflexes.  Skin: Skin is warm.  Psychiatric: She has a normal mood and affect. Her behavior is normal. Judgment and thought content normal.       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 Since she has been trying for a while, I also offered laparoscopy and possibly lysis of adhesions as well. All of the patients questions were answered to her satisfaction.

## 2014-07-04 NOTE — Transfer of Care (Signed)
Immediate Anesthesia Transfer of Care Note  Patient: [name]  Procedure(s) Performed: Procedure(s): HYSTEROSCOPY WITH LYSIS OF INTER UTERINE ADHESIONS (N/A) LAPAROSCOPY WITH EXTENSIVE LYSIS OF ADHESIONS, BILATERAL SALPINGO-OOPHOROLYSIS. (N/A)  Patient Location: PACU  Anesthesia Type:General  Level of Consciousness: awake and oriented  Airway & Oxygen Therapy: Patient Spontanous Breathing and Patient connected to nasal cannula oxygen  Post-op Assessment: Report given to PACU RN  Post vital signs: Reviewed and stable  Complications: No apparent anesthesia complications

## 2014-07-04 NOTE — Anesthesia Preprocedure Evaluation (Signed)
Anesthesia Evaluation  Patient identified by MRN, date of birth, ID band Patient awake    Reviewed: Allergy & Precautions, H&P , NPO status , Patient's Chart, lab work & pertinent test results  Airway Mallampati: II  TM Distance: >3 FB Neck ROM: Full    Dental no notable dental hx.    Pulmonary neg pulmonary ROS,  breath sounds clear to auscultation  Pulmonary exam normal       Cardiovascular negative cardio ROS  Rhythm:Regular Rate:Normal     Neuro/Psych negative neurological ROS  negative psych ROS   GI/Hepatic Neg liver ROS, GERD-  ,  Endo/Other  negative endocrine ROS  Renal/GU negative Renal ROS  negative genitourinary   Musculoskeletal negative musculoskeletal ROS (+)   Abdominal   Peds negative pediatric ROS (+)  Hematology negative hematology ROS (+)   Anesthesia Other Findings   Reproductive/Obstetrics negative OB ROS                             Anesthesia Physical Anesthesia Plan  ASA: I  Anesthesia Plan: General   Post-op Pain Management:    Induction: Intravenous  Airway Management Planned: Oral ETT  Additional Equipment:   Intra-op Plan:   Post-operative Plan: Extubation in OR  Informed Consent: I have reviewed the patients History and Physical, chart, labs and discussed the procedure including the risks, benefits and alternatives for the proposed anesthesia with the patient or authorized representative who has indicated his/her understanding and acceptance.   Dental advisory given  Plan Discussed with: CRNA  Anesthesia Plan Comments:         Anesthesia Quick Evaluation

## 2014-07-04 NOTE — Op Note (Signed)
OPERATIVE NOTE  Preoperative diagnosis: Intrauterine adhesions versus adenomyosis, rule out pelvic adhesions post myomectomy  Postoperative diagnosis: Intrauterine adhesions, pelvic adhesions, recurrent uterine fibroids  Procedure: Hysteroscopy, Lysis of adhesions, suction D&C, intrauterine stent placement, laparoscopy, bilateral salpingo-oophorolysis, enterolysis  Surgeon: Governor Specking  Anesthesia: General  Complications: None  Estimated blood loss: Less than 20 mL  Specimen: Endometrial curettings, pelvic adhesions to pathology  Findings:  Endocervical canal appeared normal. Endometrial cavity was initially hard to visualize because of shaggy endometrium. After suction D&C, what appeared to be the fundus of the uterine cavity was still full of shaggy protrusions. Later it became evident that this was the lower margin of marginal adhesions alongside the entire fundus. Small inlets were seen corresponding to the cornual region on each side. When the marginal adhesions were sharply incised, a normal sized uterine cavity was noted. The uterus sounded to 7.5 cm before lysis of adhesions and 8.5 cm after the lysis of adhesions.  On laparoscopy the liver edge and gallbladder and the appendix appeared normal. There were filmy adhesions in the anterior cul-de-sac. The uterus contained a 2 x 2 centimeter intramural myoma anteriorly and several 1 x 1 cm subserosal myomas posteriorly. The rectum and the epiploic appendices were adherent to the midline of the uterus posteriorly in a cohesive manner. One of the subserosal myomas were involved in these adhesions. These were lysed. At the bottom of the posterior cul-de-sac there were many filmy adhesions. These were lysed. The left tube was fixed with multiple filmy adhesions, drawing the fimbriated end which was rated as 4 out of 5, to the bottom of the posterior cul-de-sac, away from the left ovary. The left ovary was 50% covered with cohesive  adhesions. Approximately half of these adhesions were lysed. The right ovary was experienced with one third of its surface area of dense manner to the posterior lateral aspect of the uterus. The right fallopian tube had 4 out of 5 fimbria and had filmy adhesions to the proximal half extending from the tube to the ovary. These were lysed. In addition adhesions kinked the midpoint of the right tube and this was also lysed. On chromotubation the left tube filled and spilled the right tube did not fill.  Description of procedure:  Patient was placed in dorsal supine position. General anesthesia was administered. She was placed in lithotomy position. She was prepped and draped in sterile manner. A vaginal speculum was placed. A dilute vasopressin solution containing 0.33 units per milliliter was injected into the cervical stroma x5 cc. A Slimline hysteroscope with 30 lens was inserted into the canal and above findings were noted. Distention medium was normal saline. Distention method was gravity. Above findings were noted. Using a manual evacuation device with a 7 mm curette a suction curettage was performed. Using hysteroscopic scissors, the dense marginal adhesions were taken down. The inlets into the bilateral cornual regions of the true fundus were noted. These inlets were widened medially giving a better perspective on the location of the true fundus. The tissue between these 2 inlets were considered to be marginal adhesions obliterating the fundus and they were carefully cut scissors. I stopped cutting at the level of the line joining both tubal ostia. Hysteroscopy procedure was terminated here and a ZUMI manipulator/injector was placed into the uterus and its balloon was inflated. The surgeon was regowned and regloved and an operative field was created on the abdomen. All incisions were preemptively anesthetized with quarter percent bupivacaine with 1 200,000 epinephrine. Infraumbilical 5 mm  skin incision  was made and a varies needle was inserted. Its correct location was confirmed. A pneumoperitoneum was created with carbon dioxide. A 5 mm trocar was inserted and video laparoscopy was started with a 30 telescope. 2 other 5 mm incisions were made in each lower quadrant and corresponding trochars were inserted under direct visualization. Above findings were noted. Using a needle electrode with 35 W cutting current, posterior cul-de-sac dense adhesions were meticulously taken down.  Next extensive salpingo-oophorolysis was carried out, but the right ovary was not attempted. Good tubal mobility was obtained bilaterally at the end of this procedure. The rest of the posterior cul-de-sac filmy adhesions were also taken down. Laparoscopy procedure was terminated here and the. trochars were removed. The gas was allowed to escape. The incisions were approximated with Dermabond. Next the surgeon moved back to complete the transvaginal remainder of the procedure. An 8 mL Cook intrauterine balloon stent was rolled around a tonsil clamp, and inserted into the endometrial cavity.  It was allowed to reform its shape by distending and with fluid and then the fluid was taken out, leaving only 3 mL in the balloon.  The stem of the stent was sutured to the cervix with 2-0 silk. Hemostasis was insured. Instrument count was correct. Estimated blood loss was 100 mL. The patient tolerated the procedure well and was transferred to recovery in satisfactory condition. She will take doxycycline 100 mg twice a day for 2 weeks. She will also take Estrace 4 mg twice a day for 30 days, to the last 5 days or which medroxyprogesterone acetate 10 mg daily will be added. The stent will be removed in the office in 2 weeks. Saline contrast SAG or an HSG will be done in 6 weeks.   Governor Specking

## 2014-07-04 NOTE — Discharge Instructions (Signed)
Call your surgeon if you experience:   1.  Fever over 101.0. 2.  Inability to urinate. 3.  Nausea and/or vomiting. 4.  Extreme swelling or bruising at the surgical site. 5.  Continued bleeding from the incision. 6.  Increased pain, redness or drainage from the incision. 7.  Problems related to your pain medication. 8. Any change in color, movement and/or sensation 9. Any problems and/or concerns    Post Anesthesia Home Care Instructions  Activity: Get plenty of rest for the remainder of the day. A responsible adult should stay with you for 24 hours following the procedure.  For the next 24 hours, DO NOT: -Drive a car -Paediatric nurse -Drink alcoholic beverages -Take any medication unless instructed by your physician -Make any legal decisions or sign important papers.  Meals: Start with liquid foods such as gelatin or soup. Progress to regular foods as tolerated. Avoid greasy, spicy, heavy foods. If nausea and/or vomiting occur, drink only clear liquids until the nausea and/or vomiting subsides. Call your physician if vomiting continues.  Special Instructions/Symptoms: Your throat may feel dry or sore from the anesthesia or the breathing tube placed in your throat during surgery. If this causes discomfort, gargle with warm salt water. The discomfort should disappear within 24 hours.    Diagnostic Laparoscopy Laparoscopy is a surgical procedure. It is used to diagnose and treat diseases inside the belly (abdomen). It is usually a brief, common, and relatively simple procedure. The laparoscopeis a thin, lighted, pencil-sized instrument. It is like a telescope. It is inserted into your abdomen through a small cut (incision). Your caregiver can look at the organs inside your body through this instrument. He or she can see if there is anything abnormal. Laparoscopy can be done either in a hospital or outpatient clinic. You may be given a mild sedative to help you relax before the  procedure. Once in the operating room, you will be given a drug to make you sleep (general anesthesia). Laparoscopy usually lasts less than 1 hour. After the procedure, you will be monitored in a recovery area until you are stable and doing well. Once you are home, it will take 2 to 3 days to fully recover. RISKS AND COMPLICATIONS  Laparoscopy has relatively few risks. Your caregiver will discuss the risks with you before the procedure. Some problems that can occur include:  Infection.  Bleeding.  Damage to other organs.  Anesthetic side effects. PROCEDURE Once you receive anesthesia, your surgeon inflates the abdomen with a harmless gas (carbon dioxide). This makes the organs easier to see. The laparoscope is inserted into the abdomen through a small incision. This allows your surgeon to see into the abdomen. Other small instruments are also inserted into the abdomen through other small openings. Many surgeons attach a video camera to the laparoscope to enlarge the view. During a diagnostic laparoscopy, the surgeon may be looking for inflammation, infection, or cancer. Your surgeon may take tissue samples(biopsies). The samples are sent to a specialist in looking at cells and tissue samples (pathologist). The pathologist examines them under a microscope. Biopsies can help to diagnose or confirm a disease. AFTER THE PROCEDURE   The gas is released from inside the abdomen.  The incisions are closed with stitches (sutures). Because these incisions are small (usually less than 1/2 inch), there is usually minimal discomfort after the procedure. There may be some mild discomfort in the throat. This is from the tube placed in the throat while you were sleeping. You may  have some mild abdominal discomfort. There may also be discomfort from the instrument placement incisions in the abdomen.  The recovery time is shortened as long as there are no complications.  You will rest in a recovery room until  stable and doing well. As long as there are no complications, you may be allowed to go home. FINDING OUT THE RESULTS OF YOUR TEST Not all test results are available during your visit. If your test results are not back during the visit, make an appointment with your caregiver to find out the results. Do not assume everything is normal if you have not heard from your caregiver or the medical facility. It is important for you to follow up on all of your test results. HOME CARE INSTRUCTIONS   Take all medicines as directed.  Only take over-the-counter or prescription medicines for pain, discomfort, or fever as directed by your caregiver.  Resume daily activities as directed.  Showers are preferred over baths.  You may resume sexual activities in 1 week or as directed.  Do not drive while taking narcotics. SEEK MEDICAL CARE IF:   There is increasing abdominal pain.  There is new pain in the shoulders (shoulder strap areas).  You feel lightheaded or faint.  You have the chills.  You or your child has an oral temperature above 102 F (38.9 C).  There is pus-like (purulent) drainage from any of the wounds.  You are unable to pass gas or have a bowel movement.  You feel sick to your stomach (nauseous) or throw up (vomit). MAKE SURE YOU:   Understand these instructions.  Will watch your condition.  Will get help right away if you are not doing well or get worse. Document Released: 11/29/2000 Document Revised: 12/18/2012 Document Reviewed: 08/23/2007 [facility] Patient Information 2015 [place], [place]. This information is not intended to replace advice given to you by your health care provider. Make sure you discuss any questions you have with your health care provider.

## 2014-07-04 NOTE — Anesthesia Procedure Notes (Signed)
Procedure Name: Intubation Date/Time: 07/04/2014 2:01 PM Performed by: Mechele Claude Pre-anesthesia Checklist: Patient identified, Emergency Drugs available, Suction available and Patient being monitored Patient Re-evaluated:Patient Re-evaluated prior to inductionOxygen Delivery Method: Circle System Utilized Preoxygenation: Pre-oxygenation with 100% oxygen Intubation Type: IV induction Ventilation: Mask ventilation without difficulty Laryngoscope Size: Mac and 3 Tube type: Oral Tube size: 7.0 mm Number of attempts: 1 Airway Equipment and Method: stylet and oral airway Placement Confirmation: ETT inserted through vocal cords under direct vision,  positive ETCO2 and breath sounds checked- equal and bilateral Secured at: 21 cm Tube secured with: Tape Dental Injury: Teeth and Oropharynx as per pre-operative assessment

## 2014-07-05 ENCOUNTER — Encounter (HOSPITAL_BASED_OUTPATIENT_CLINIC_OR_DEPARTMENT_OTHER): Payer: Self-pay | Admitting: Obstetrics and Gynecology

## 2014-07-08 NOTE — Anesthesia Postprocedure Evaluation (Signed)
Anesthesia Post Note  Patient: Nicole Wiggins  Procedure(s) Performed: Procedure(s) (LRB): HYSTEROSCOPY WITH LYSIS OF INTER UTERINE ADHESIONS (N/A) LAPAROSCOPY WITH EXTENSIVE LYSIS OF ADHESIONS, BILATERAL SALPINGO-OOPHOROLYSIS. (N/A)  Anesthesia type: General  Patient location: PACU  Post pain: Pain level controlled  Post assessment: Post-op Vital signs reviewed  Last Vitals: BP 109/65 mmHg  Pulse 90  Temp(Src) 36.6 C (Oral)  Resp 16  Ht 5\' 5"  (1.651 m)  Wt 164 lb (74.39 kg)  BMI 27.29 kg/m2  SpO2 100%  LMP 06/10/2014  Post vital signs: Reviewed  Level of consciousness: sedated  Complications: No apparent anesthesia complications

## 2014-10-28 ENCOUNTER — Ambulatory Visit: Payer: Federal, State, Local not specified - PPO | Admitting: Family Medicine

## 2014-11-01 ENCOUNTER — Encounter: Payer: Self-pay | Admitting: Family Medicine

## 2014-11-01 ENCOUNTER — Ambulatory Visit (INDEPENDENT_AMBULATORY_CARE_PROVIDER_SITE_OTHER): Payer: Federal, State, Local not specified - PPO | Admitting: Family Medicine

## 2014-11-01 VITALS — BP 133/70 | HR 52 | Ht 65.0 in | Wt 161.0 lb

## 2014-11-01 DIAGNOSIS — Z0000 Encounter for general adult medical examination without abnormal findings: Secondary | ICD-10-CM | POA: Diagnosis not present

## 2014-11-01 NOTE — Patient Instructions (Signed)
Dr. Eneida Evers's General Advice Following Your Complete Physical Exam  The Benefits of Regular Exercise: Unless you suffer from an uncontrolled cardiovascular condition, studies strongly suggest that regular exercise and physical activity will add to both the quality and length of your life.  The World Health Organization recommends 150 minutes of moderate intensity aerobic activity every week.  This is best split over 3-4 days a week, and can be as simple as a brisk walk for just over 35 minutes "most days of the week".  This type of exercise has been shown to lower LDL-Cholesterol, lower average blood sugars, lower blood pressure, lower cardiovascular disease risk, improve memory, and increase one's overall sense of wellbeing.  The addition of anaerobic (or "strength training") exercises offers additional benefits including but not limited to increased metabolism, prevention of osteoporosis, and improved overall cholesterol levels.  How Can I Strive For A Low-Fat Diet?: Current guidelines recommend that 25-35 percent of your daily energy (food) intake should come from fats.  One might ask how can this be achieved without having to dissect each meal on a daily basis?  Switch to skim or 1% milk instead of whole milk.  Focus on lean meats such as ground turkey, fresh fish, baked chicken, and lean cuts of beef as your source of dietary protein.  Limit saturated fat consumption to less than 10% of your daily caloric intake.  Limit trans fatty acid consumption primarily by limiting synthetic trans fats such as partially hydrogenated oils (Ex: fried fast foods).  Substitute olive or vegetable oil for solid fats where possible.  Moderation of Salt Intake: Provided you don't carry a diagnosis of congestive heart failure nor renal failure, I recommend a daily allowance of no more than 2300 mg of salt (sodium).  Keeping under this daily goal is associated with a decreased risk of cardiovascular events, creeping  above it can lead to elevated blood pressures and increases your risk of cardiovascular events.  Milligrams (mg) of salt is listed on all nutrition labels, and your daily intake can add up faster than you think.  Most canned and frozen dinners can pack in over half your daily salt allowance in one meal.    Lifestyle Health Risks: Certain lifestyle choices carry specific health risks.  As you may already know, tobacco use has been associated with increasing one's risk of cardiovascular disease, pulmonary disease, numerous cancers, among many other issues.  What you may not know is that there are medications and nicotine replacement strategies that can more than double your chances of successfully quitting.  I would be thrilled to help manage your quitting strategy if you currently use tobacco products.  When it comes to alcohol use, I've yet to find an "ideal" daily allowance.  Provided an individual does not have a medical condition that is exacerbated by alcohol consumption, general guidelines determine "safe drinking" as no more than two standard drinks for a man or no more than one standard drink for a female per day.  However, much debate still exists on whether any amount of alcohol consumption is technically "safe".  My general advice, keep alcohol consumption to a minimum for general health promotion.  If you or others believe that alcohol, tobacco, or recreational drug use is interfering with your life, I would be happy to provide confidential counseling regarding treatment options.  General "Over The Counter" Nutrition Advice: Postmenopausal women should aim for a daily calcium intake of 1200 mg, however a significant portion of this might already be   provided by diets including milk, yogurt, cheese, and other dairy products.  Vitamin D has been shown to help preserve bone density, prevent fatigue, and has even been shown to help reduce falls in the elderly.  Ensuring a daily intake of 800 Units of  Vitamin D is a good place to start to enjoy the above benefits, we can easily check your Vitamin D level to see if you'd potentially benefit from supplementation beyond 800 Units a day.  Folic Acid intake should be of particular concern to women of childbearing age.  Daily consumption of 400-800 mcg of Folic Acid is recommended to minimize the chance of spinal cord defects in a fetus should pregnancy occur.    For many adults, accidents still remain one of the most common culprits when it comes to cause of death.  Some of the simplest but most effective preventitive habits you can adopt include regular seatbelt use, proper helmet use, securing firearms, and regularly testing your smoke and carbon monoxide detectors.  Shakeira Rhee B. Tykeria Wawrzyniak DO [facility] [address], [address] [place], [place] [address] Phone: [phone]  

## 2014-11-01 NOTE — Progress Notes (Signed)
CC: Nicole Wiggins is a 34 y.o. female is here for Establish Care   Subjective: HPI:  Colonoscopy:  No current indication Papsmear: up to date as of August 2015 No indication for mammogram  Influenza Vaccine: declined Pneumovax: no indication Td/Tdap: UTD until 2022 Zoster: (Start 36 yo)  No alcohol, tobacco or recreational drug use              Review of Systems - General ROS: negative for - chills, fever, night sweats, weight gain or weight loss Ophthalmic ROS: negative for - decreased vision Psychological ROS: negative for - anxiety or depression ENT ROS: negative for - hearing change, nasal congestion, tinnitus or allergies Hematological and Lymphatic ROS: negative for - bleeding problems, bruising or swollen lymph nodes Breast ROS: negative Respiratory ROS: no cough, shortness of breath, or wheezing Cardiovascular ROS: no chest pain or dyspnea on exertion Gastrointestinal ROS: no abdominal pain, change in bowel habits, or black or bloody stools Genito-Urinary ROS: negative for - genital discharge, genital ulcers, incontinence or abnormal bleeding from genitals Musculoskeletal ROS: negative for - joint pain or muscle pain Neurological ROS: negative for - headaches or memory loss Dermatological ROS: negative for lumps, mole changes, rash and skin lesion changes  Past Medical History  Diagnosis Date  . Eczema     Neck, behind ears and scalp  . Asherman's syndrome   . Wears contact lenses   . Hyperlipidemia     Past Surgical History  Procedure Laterality Date  . Dx laparoscopy/  laparotomy myomectomy  2013  . Hysteroscopy N/A 07/04/2014    Procedure: HYSTEROSCOPY WITH LYSIS OF INTER UTERINE ADHESIONS;  Surgeon: Governor Specking, MD;  Location: [facility];  Service: Gynecology;  Laterality: N/A;  . Laparoscopy N/A 07/04/2014    Procedure: LAPAROSCOPY WITH EXTENSIVE LYSIS OF ADHESIONS, BILATERAL SALPINGO-OOPHOROLYSIS.;  Surgeon: Governor Specking, MD;   Location: [facility];  Service: Gynecology;  Laterality: N/A;   Family History  Problem Relation Age of Onset  . Adopted: Yes    History   Social History  . Marital Status: Married    Spouse Name: N/A  . Number of Children: N/A  . Years of Education: N/A   Occupational History  . Not on file.   Social History Main Topics  . Smoking status: Never Smoker   . Smokeless tobacco: Never Used  . Alcohol Use: No  . Drug Use: No  . Sexual Activity: Yes   Other Topics Concern  . 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  No nasal discharge, hearing grossly intact, TMs with good landmarks bilaterally with no middle ear abnormalities, posterior pharynx clear without oral lesions. Neck: Supple, trachea midline,  no cervical nor supraclavicular adenopathy. Pulmonary: Clear to auscultation bilaterally without wheezing, rhonchi, nor rales. Cardiac: Regular rate and rhythm.  No murmurs, rubs, nor gallops. No peripheral edema.  2+ peripheral pulses bilaterally. Abdomen: Bowel sounds normal.  No masses.  Non-tender without rebound.  Negative Murphy's sign. GU: deferred to GYN MSK: Grossly intact, no signs of weakness.  Full strength throughout upper and lower extremities.  Full ROM in upper and lower extremities.  No midline spinal tenderness. Neuro: Gait unremarkable, CN II-XII grossly intact.  C5-C6  Reflex 2/4 Bilaterally, L4 Reflex 2/4 Bilaterally.  Cerebellar function intact. Skin: No rashes. Psych: Alert and oriented to person/place/time.  Thought process normal. No anxiety/depression.   Assessment & Plan: Nicole Wiggins was seen today for establish care.  Diagnoses and all orders for this visit:  Annual physical exam Orders: -     Lipid panel -     COMPLETE METABOLIC PANEL WITH GFR -     CBC  Healthy lifestyle interventions including but not limited to regular exercise, a healthy low fat diet, moderation of salt intake, the dangers of tobacco/alcohol/recreational drug use, nutrition supplementation, and accident avoidance were discussed with the patient and a handout was provided for future reference.  Return in about 1 year (around 11/02/2015) for Annual Visits.

## 2014-11-02 LAB — COMPLETE METABOLIC PANEL WITH GFR
ALT: 14 U/L (ref 0–35)
AST: 22 U/L (ref 0–37)
Albumin: 4.2 g/dL (ref 3.5–5.2)
Alkaline Phosphatase: 54 U/L (ref 39–117)
BILIRUBIN TOTAL: 0.5 mg/dL (ref 0.2–1.2)
BUN: 12 mg/dL (ref 6–23)
CALCIUM: 9.6 mg/dL (ref 8.4–10.5)
CO2: 28 mEq/L (ref 19–32)
Chloride: 102 mEq/L (ref 96–112)
Creat: 0.86 mg/dL (ref 0.50–1.10)
GFR, Est African American: >89 mL/min
GFR, Est Non African American: 87 mL/min
Glucose, Bld: 72 mg/dL (ref 70–99)
Potassium: 4.3 mEq/L (ref 3.5–5.3)
Sodium: 138 mEq/L (ref 135–145)
Total Protein: 7.5 g/dL (ref 6.0–8.3)

## 2014-11-02 LAB — CBC
HEMATOCRIT: 38.7 % (ref 36.0–46.0)
Hemoglobin: 12.5 g/dL (ref 12.0–15.0)
MCH: 28.1 pg (ref 26.0–34.0)
MCHC: 32.3 g/dL (ref 30.0–36.0)
MCV: 87 fL (ref 78.0–100.0)
MPV: 9.1 fL (ref 8.6–12.4)
PLATELETS: 313 10*3/uL (ref 150–400)
RBC: 4.45 MIL/uL (ref 3.87–5.11)
RDW: 13.8 % (ref 11.5–15.5)
WBC: 6.9 10*3/uL (ref 4.0–10.5)

## 2014-11-02 LAB — LIPID PANEL
CHOL/HDL RATIO: 2.9 ratio
Cholesterol: 252 mg/dL — ABNORMAL HIGH (ref 0–200)
HDL: 87 mg/dL (ref >=46)
LDL Cholesterol: 156 mg/dL — ABNORMAL HIGH (ref 0–99)
Triglycerides: 46 mg/dL (ref <150)
VLDL: 9 mg/dL (ref 0–40)

## 2014-11-04 ENCOUNTER — Encounter: Payer: Self-pay | Admitting: Family Medicine

## 2014-11-04 DIAGNOSIS — E785 Hyperlipidemia, unspecified: Secondary | ICD-10-CM | POA: Insufficient documentation

## 2014-12-03 ENCOUNTER — Encounter: Payer: Self-pay | Admitting: Family Medicine

## 2014-12-03 ENCOUNTER — Ambulatory Visit (INDEPENDENT_AMBULATORY_CARE_PROVIDER_SITE_OTHER): Payer: Federal, State, Local not specified - PPO | Admitting: Family Medicine

## 2014-12-03 VITALS — BP 112/71 | HR 81 | Wt 163.0 lb

## 2014-12-03 DIAGNOSIS — S29011A Strain of muscle and tendon of front wall of thorax, initial encounter: Secondary | ICD-10-CM

## 2014-12-03 DIAGNOSIS — R079 Chest pain, unspecified: Secondary | ICD-10-CM | POA: Diagnosis not present

## 2014-12-03 LAB — EKG 12-LEAD

## 2014-12-03 MED ORDER — FISH OIL 1000 MG PO CAPS: ORAL_CAPSULE | ORAL | Status: DC

## 2014-12-03 NOTE — Progress Notes (Signed)
CC: Nicole Wiggins is a 34 y.o. female is here for left shoulder pain and Chest Pain   Subjective: HPI:  Left-sided chest pressure that started late last night. Seems to come in waves, episodic. Nothing particularly makes it better or worse. It's only happening when she is sitting and resting never with exertion. Localized just inferior and medial to the anterior left shoulder. Nonradiating. No interventions as of yet. She is worried that it could be her heart since she has a long history of elevated cholesterol. Symptoms are mild in severity. She's never had this before. Denies shortness of breath, irregular heartbeat, discomfort with breathing, cough, wheezing, nor any pulmonary complaints. She stopped doing her daily gym routine for 2 weeks and then restarted it a few days ago, there is been no other change to her daily activities.no abdominal pain or back pain  Review Of Systems Outlined In HPI  Past Medical History  Diagnosis Date  . Eczema     Neck, behind ears and scalp  . Asherman's syndrome   . Wears contact lenses   . Hyperlipidemia     Past Surgical History  Procedure Laterality Date  . Dx laparoscopy/  laparotomy myomectomy  2013  . Hysteroscopy N/A 07/04/2014    Procedure: HYSTEROSCOPY WITH LYSIS OF INTER UTERINE ADHESIONS;  Surgeon: Governor Specking, MD;  Location: [facility];  Service: Gynecology;  Laterality: N/A;  . Laparoscopy N/A 07/04/2014    Procedure: LAPAROSCOPY WITH EXTENSIVE LYSIS OF ADHESIONS, BILATERAL SALPINGO-OOPHOROLYSIS.;  Surgeon: Governor Specking, MD;  Location: [facility];  Service: Gynecology;  Laterality: N/A;   Family History  Problem Relation Age of Onset  . Adopted: Yes    History   Social History  . Marital Status: Married    Spouse Name: N/A  . Number of Children: N/A  . Years of Education: N/A   Occupational History  . Not on file.   Social History Main Topics  . Smoking status: Never Smoker   . Smokeless  tobacco: Never Used  . Alcohol Use: No  . Drug Use: No  . Sexual Activity: Yes   Other Topics Concern  . 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 Conjunctivae clear.  moist mucous membranes pharynx unremarkable Lungs: Clear to auscultation bilaterally, no wheezing/ronchi/rales.  Comfortable work of breathing. Good air movement. Cardiac: Regular rate and rhythm. Normal S1/S2.  No murmurs, rubs, nor gallops.   Left shoulder exam reveals full range of motion and strength in all planes of motion and with individual rotator cuff testing. No overlying redness warmth or swelling.  Neer's test negative.  Hawkins test negative. Empty can negative. Crossarm test negative. O'Brien's test negative. Apprehension test negative. Speed's test negative. Extremities: No peripheral edema.  Strong peripheral pulses.  Mental Status: No depression, anxiety, nor agitation. Skin: Warm and dry.  Assessment & Plan: Chiniqua was seen today for left shoulder pain and chest pain.  Diagnoses and all orders for this visit:  Chest wall muscle strain, initial encounter Orders: -     Omega-3 Fatty Acids (FISH OIL) 1000 MG CAPS; One capsule by mouth with breakfast and dinner to help with cholesterol.   EKG was obtained showing normal sinus rhythm with a first-degree AV block. No pathologic Q waves. No ST segment elevation or depression. She was having an episode of her discomfort while the EKG  was obtained. Reassured her that I do not think that is coming heart or lungs. Suspect this is due to a strain of the pectoralis muscle and should resolve on its own within the next 1 or 2 weeks.Signs and symptoms requring emergent/urgent reevaluation were discussed with the patient. She also wants know if she can do other than taking medication for cholesterol, she is open to the idea of taking fissural twice a day. And continuing with her exercise routine.Return if symptoms worsen or fail to improve.

## 2014-12-05 NOTE — Addendum Note (Signed)
Addended by: Narda Rutherford on: 12/05/2014 09:16 AM   Modules accepted: Orders

## 2015-02-09 ENCOUNTER — Encounter: Payer: Self-pay | Admitting: Emergency Medicine

## 2015-02-09 ENCOUNTER — Emergency Department (INDEPENDENT_AMBULATORY_CARE_PROVIDER_SITE_OTHER)
Admission: EM | Admit: 2015-02-09 | Discharge: 2015-02-09 | Disposition: A | Discharge status: Home / Self Care | Payer: Federal, State, Local not specified - PPO | Source: Home / Self Care | Attending: Emergency Medicine | Admitting: Emergency Medicine

## 2015-02-09 DIAGNOSIS — S61210A Laceration without foreign body of right index finger without damage to nail, initial encounter: Secondary | ICD-10-CM

## 2015-02-09 MED ORDER — HYDROCODONE-ACETAMINOPHEN 5-325 MG PO TABS: 2.0000 | ORAL_TABLET | ORAL | Status: DC | PRN

## 2015-02-09 NOTE — ED Notes (Signed)
Patient cut her right index finger while washing a kitchen knife within past hour; bleeding. States tetanus immunization up to date.

## 2015-02-09 NOTE — Discharge Instructions (Signed)

## 2015-02-09 NOTE — ED Provider Notes (Signed)
CSN: [id number]     Arrival date & time 02/09/15  1607 History   First MD Initiated Contact with Patient 02/09/15 1608     Chief Complaint  Patient presents with  . Finger Injury   (Consider location/radiation/quality/duration/timing/severity/associated sxs/prior Treatment) Patient is a 34 y.o. female presenting with skin laceration. The history is provided by the patient. No language interpreter was used.  Laceration Location:  Hand Hand laceration location:  R finger Length (cm):  1 Depth:  Through underlying tissue Quality: straight   Bleeding: controlled   Time since incident:  1 hour Laceration mechanism:  Knife Pain details:    Quality:  Aching   Severity:  Mild   Timing:  Constant Foreign body present:  No foreign bodies Relieved by:  Nothing Worsened by:  Nothing tried Ineffective treatments:  None tried Tetanus status:  Unknown Pt cut her finger with a kitchen knife  Past Medical History  Diagnosis Date  . Eczema     Neck, behind ears and scalp  . Asherman's syndrome   . Wears contact lenses   . Hyperlipidemia    Past Surgical History  Procedure Laterality Date  . Dx laparoscopy/  laparotomy myomectomy  2013  . Hysteroscopy N/A 07/04/2014    Procedure: HYSTEROSCOPY WITH LYSIS OF INTER UTERINE ADHESIONS;  Surgeon: Governor Specking, MD;  Location: [facility];  Service: Gynecology;  Laterality: N/A;  . Laparoscopy N/A 07/04/2014    Procedure: LAPAROSCOPY WITH EXTENSIVE LYSIS OF ADHESIONS, BILATERAL SALPINGO-OOPHOROLYSIS.;  Surgeon: Governor Specking, MD;  Location: [facility];  Service: Gynecology;  Laterality: N/A;   Family History  Problem Relation Age of Onset  . Adopted: Yes   History  Substance Use Topics  . Smoking status: Never Smoker   . Smokeless tobacco: Never Used  . Alcohol Use: No   OB History    No data available     Review of Systems  All other systems reviewed and are negative.   Allergies  Review of  patient's allergies indicates no known allergies.  Home Medications   Prior to Admission medications   Medication Sig Start Date End Date Taking? Authorizing Provider  HYDROcodone-acetaminophen (NORCO/VICODIN) 5-325 MG per tablet Take 2 tablets by mouth every 4 (four) hours as needed. 02/09/15   Fransico Meadow, PA-C  Magnesium 400 MG CAPS Take by mouth.    Historical Provider, MD  Misc Natural Products (PROSTATE SUPPORT) 300-15 MG TABS Take 1 tablet by mouth daily.    Historical Provider, MD  Multiple Vitamins-Minerals (WOMENS MULTIVITAMIN PLUS) TABS Take by mouth.    Historical Provider, MD  Omega-3 Fatty Acids (FISH OIL) 1000 MG CAPS One capsule by mouth with breakfast and dinner to help with cholesterol. 12/03/14   Sean Hommel, DO  ondansetron (ZOFRAN) 4 MG tablet Take 1 tablet (4 mg total) by mouth every 8 (eight) hours as needed for nausea or vomiting. 07/04/14   Governor Specking, MD  Probiotic Product (PROBIOTIC DAILY PO) Take by mouth.    Historical Provider, MD  Zinc 30 MG CAPS Take by mouth.    Historical Provider, MD   BP 115/70 mmHg  Pulse 75  Temp(Src) 98.9 F (37.2 C) (Oral)  Resp 18  Ht 5\' 5"  (1.651 m)  Wt 160 lb (72.576 kg)  BMI 26.63 kg/m2  SpO2 96% Physical Exam  Constitutional: She is oriented to person, place, and time. She appears well-developed and well-nourished.  HENT:  Head: Normocephalic and atraumatic.  Eyes: EOM are normal.  Neck: Normal range of motion.  Pulmonary/Chest: Effort normal.  Abdominal: She exhibits no distension.  Musculoskeletal:  1cm laceration right index finger,  From,  nv and ns intact  Neurological: She is alert and oriented to person, place, and time.  Skin: Skin is warm.  Psychiatric: She has a normal mood and affect.  Nursing note and vitals reviewed.   ED Course  LACERATION REPAIR Date/Time: 02/09/2015 5:44 PM Performed by: Fransico Meadow Authorized by: Burnett Harry, DAVID Consent: Verbal consent not obtained. Risks and benefits:  risks, benefits and alternatives were discussed Consent given by: patient Patient understanding: patient states understanding of the procedure being performed Required items: required blood products, implants, devices, and special equipment available Time out: Immediately prior to procedure a "time out" was called to verify the correct patient, procedure, equipment, support staff and site/side marked as required. Body area: upper extremity Location details: right index finger Laceration length: 1 cm Foreign bodies: no foreign bodies Tendon involvement: none Nerve involvement: none Vascular damage: no Anesthesia: local infiltration Preparation: Patient was prepped and draped in the usual sterile fashion. Debridement: none Skin closure: Ethilon Number of sutures: 3 Technique: simple Approximation: loose Approximation difficulty: simple Patient tolerance: Patient tolerated the procedure well with no immediate complications   (including critical care time) Labs Review Labs Reviewed - No data to display  Imaging Review No results found.   MDM   1. Laceration of right index finger w/o foreign body w/o damage to nail, initial encounter    Suture remoaval in 8 days Wound care avs    Fransico Meadow, PA-C 02/09/15 1745

## 2015-02-17 ENCOUNTER — Emergency Department
Admission: EM | Admit: 2015-02-17 | Discharge: 2015-02-17 | Disposition: A | Discharge status: Home / Self Care | Payer: Federal, State, Local not specified - PPO | Source: Home / Self Care

## 2015-02-17 NOTE — ED Notes (Signed)
Suture removal right index finger. Laceration looks closed and without infection.

## 2016-01-23 DIAGNOSIS — L700 Acne vulgaris: Secondary | ICD-10-CM | POA: Diagnosis not present

## 2016-03-02 DIAGNOSIS — K080 Exfoliation of teeth due to systemic causes: Secondary | ICD-10-CM | POA: Diagnosis not present

## 2016-03-15 DIAGNOSIS — F418 Other specified anxiety disorders: Secondary | ICD-10-CM | POA: Diagnosis not present

## 2016-04-02 DIAGNOSIS — L218 Other seborrheic dermatitis: Secondary | ICD-10-CM | POA: Diagnosis not present

## 2016-05-19 ENCOUNTER — Other Ambulatory Visit: Payer: Self-pay | Admitting: Obstetrics and Gynecology

## 2016-05-19 ENCOUNTER — Other Ambulatory Visit (HOSPITAL_COMMUNITY)
Admission: RE | Admit: 2016-05-19 | Discharge: 2016-05-19 | Disposition: A | Discharge status: Home / Self Care | Payer: Federal, State, Local not specified - PPO | Source: Ambulatory Visit | Attending: Obstetrics and Gynecology | Admitting: Obstetrics and Gynecology

## 2016-05-19 DIAGNOSIS — Z01419 Encounter for gynecological examination (general) (routine) without abnormal findings: Secondary | ICD-10-CM | POA: Insufficient documentation

## 2016-05-19 DIAGNOSIS — Z1151 Encounter for screening for human papillomavirus (HPV): Secondary | ICD-10-CM | POA: Diagnosis not present

## 2016-05-19 DIAGNOSIS — Z113 Encounter for screening for infections with a predominantly sexual mode of transmission: Secondary | ICD-10-CM | POA: Insufficient documentation

## 2016-05-19 DIAGNOSIS — L918 Other hypertrophic disorders of the skin: Secondary | ICD-10-CM | POA: Diagnosis not present

## 2016-05-19 DIAGNOSIS — L700 Acne vulgaris: Secondary | ICD-10-CM | POA: Diagnosis not present

## 2016-05-19 DIAGNOSIS — R102 Pelvic and perineal pain: Secondary | ICD-10-CM | POA: Diagnosis not present

## 2016-05-19 DIAGNOSIS — R635 Abnormal weight gain: Secondary | ICD-10-CM | POA: Diagnosis not present

## 2016-05-21 LAB — CYTOLOGY - PAP

## 2016-06-02 DIAGNOSIS — R7303 Prediabetes: Secondary | ICD-10-CM | POA: Diagnosis not present

## 2016-06-02 DIAGNOSIS — R102 Pelvic and perineal pain: Secondary | ICD-10-CM | POA: Diagnosis not present

## 2016-06-02 DIAGNOSIS — L700 Acne vulgaris: Secondary | ICD-10-CM | POA: Diagnosis not present

## 2016-06-02 DIAGNOSIS — Z01419 Encounter for gynecological examination (general) (routine) without abnormal findings: Secondary | ICD-10-CM | POA: Diagnosis not present

## 2016-06-21 DIAGNOSIS — R102 Pelvic and perineal pain: Secondary | ICD-10-CM | POA: Diagnosis not present

## 2016-06-21 DIAGNOSIS — D251 Intramural leiomyoma of uterus: Secondary | ICD-10-CM | POA: Diagnosis not present

## 2016-06-23 DIAGNOSIS — E785 Hyperlipidemia, unspecified: Secondary | ICD-10-CM | POA: Diagnosis not present

## 2016-06-23 DIAGNOSIS — E349 Endocrine disorder, unspecified: Secondary | ICD-10-CM | POA: Diagnosis not present

## 2016-06-23 DIAGNOSIS — L709 Acne, unspecified: Secondary | ICD-10-CM | POA: Diagnosis not present

## 2016-07-26 DIAGNOSIS — Z3141 Encounter for fertility testing: Secondary | ICD-10-CM | POA: Diagnosis not present

## 2016-07-26 DIAGNOSIS — N971 Female infertility of tubal origin: Secondary | ICD-10-CM | POA: Diagnosis not present

## 2016-07-26 DIAGNOSIS — Z319 Encounter for procreative management, unspecified: Secondary | ICD-10-CM | POA: Diagnosis not present

## 2016-07-26 DIAGNOSIS — E288 Other ovarian dysfunction: Secondary | ICD-10-CM | POA: Diagnosis not present

## 2016-07-27 DIAGNOSIS — L709 Acne, unspecified: Secondary | ICD-10-CM | POA: Diagnosis not present

## 2016-07-27 DIAGNOSIS — R5383 Other fatigue: Secondary | ICD-10-CM | POA: Diagnosis not present

## 2016-07-27 DIAGNOSIS — K5900 Constipation, unspecified: Secondary | ICD-10-CM | POA: Diagnosis not present

## 2016-07-27 DIAGNOSIS — N943 Premenstrual tension syndrome: Secondary | ICD-10-CM | POA: Diagnosis not present

## 2016-08-02 DIAGNOSIS — L700 Acne vulgaris: Secondary | ICD-10-CM | POA: Diagnosis not present

## 2016-08-03 DIAGNOSIS — Z23 Encounter for immunization: Secondary | ICD-10-CM | POA: Diagnosis not present

## 2016-08-24 DIAGNOSIS — K5900 Constipation, unspecified: Secondary | ICD-10-CM | POA: Diagnosis not present

## 2016-08-24 DIAGNOSIS — M25551 Pain in right hip: Secondary | ICD-10-CM | POA: Diagnosis not present

## 2016-08-24 DIAGNOSIS — R630 Anorexia: Secondary | ICD-10-CM | POA: Diagnosis not present

## 2016-08-25 ENCOUNTER — Ambulatory Visit
Admission: RE | Admit: 2016-08-25 | Discharge: 2016-08-25 | Disposition: A | Discharge status: Home / Self Care | Payer: Federal, State, Local not specified - PPO | Source: Ambulatory Visit | Attending: Family Medicine | Admitting: Family Medicine

## 2016-08-25 ENCOUNTER — Other Ambulatory Visit: Payer: Self-pay | Admitting: Family Medicine

## 2016-08-25 DIAGNOSIS — M25552 Pain in left hip: Secondary | ICD-10-CM | POA: Diagnosis not present

## 2016-08-25 DIAGNOSIS — R52 Pain, unspecified: Secondary | ICD-10-CM

## 2016-08-25 DIAGNOSIS — M25551 Pain in right hip: Secondary | ICD-10-CM | POA: Diagnosis not present

## 2016-08-25 DIAGNOSIS — K080 Exfoliation of teeth due to systemic causes: Secondary | ICD-10-CM | POA: Diagnosis not present

## 2016-09-15 DIAGNOSIS — R102 Pelvic and perineal pain: Secondary | ICD-10-CM | POA: Diagnosis not present

## 2016-09-15 DIAGNOSIS — R1031 Right lower quadrant pain: Secondary | ICD-10-CM | POA: Diagnosis not present

## 2016-09-15 DIAGNOSIS — R700 Elevated erythrocyte sedimentation rate: Secondary | ICD-10-CM | POA: Diagnosis not present

## 2016-09-27 ENCOUNTER — Other Ambulatory Visit: Payer: Self-pay | Admitting: Family Medicine

## 2016-09-27 DIAGNOSIS — R1031 Right lower quadrant pain: Secondary | ICD-10-CM

## 2016-09-30 ENCOUNTER — Other Ambulatory Visit: Payer: Federal, State, Local not specified - PPO

## 2016-10-01 ENCOUNTER — Ambulatory Visit
Admission: RE | Admit: 2016-10-01 | Discharge: 2016-10-01 | Disposition: A | Discharge status: Home / Self Care | Payer: Federal, State, Local not specified - PPO | Source: Ambulatory Visit | Attending: Family Medicine | Admitting: Family Medicine

## 2016-10-01 DIAGNOSIS — R102 Pelvic and perineal pain: Secondary | ICD-10-CM | POA: Diagnosis not present

## 2016-10-01 DIAGNOSIS — R1031 Right lower quadrant pain: Secondary | ICD-10-CM

## 2016-10-01 MED ORDER — IOPAMIDOL (ISOVUE-300) INJECTION 61%
100.0000 mL | Freq: Once | INTRAVENOUS | Status: AC | PRN
  Administered 2016-10-01: 100 mL via INTRAVENOUS

## 2016-10-04 ENCOUNTER — Other Ambulatory Visit: Payer: Self-pay | Admitting: Family Medicine

## 2016-10-04 DIAGNOSIS — R160 Hepatomegaly, not elsewhere classified: Secondary | ICD-10-CM

## 2016-10-06 DIAGNOSIS — Z319 Encounter for procreative management, unspecified: Secondary | ICD-10-CM | POA: Diagnosis not present

## 2016-10-06 DIAGNOSIS — D251 Intramural leiomyoma of uterus: Secondary | ICD-10-CM | POA: Diagnosis not present

## 2016-10-06 DIAGNOSIS — E288 Other ovarian dysfunction: Secondary | ICD-10-CM | POA: Diagnosis not present

## 2016-10-11 ENCOUNTER — Ambulatory Visit
Admission: RE | Admit: 2016-10-11 | Discharge: 2016-10-11 | Disposition: A | Discharge status: Home / Self Care | Payer: Federal, State, Local not specified - PPO | Source: Ambulatory Visit | Attending: Family Medicine | Admitting: Family Medicine

## 2016-10-11 DIAGNOSIS — K7689 Other specified diseases of liver: Secondary | ICD-10-CM | POA: Diagnosis not present

## 2016-10-11 DIAGNOSIS — R160 Hepatomegaly, not elsewhere classified: Secondary | ICD-10-CM

## 2016-10-11 MED ORDER — GADOXETATE DISODIUM 0.25 MMOL/ML IV SOLN
7.0000 mL | Freq: Once | INTRAVENOUS | Status: AC | PRN
  Administered 2016-10-11: 7 mL via INTRAVENOUS

## 2016-10-18 DIAGNOSIS — Z113 Encounter for screening for infections with a predominantly sexual mode of transmission: Secondary | ICD-10-CM | POA: Diagnosis not present

## 2016-10-18 DIAGNOSIS — Z319 Encounter for procreative management, unspecified: Secondary | ICD-10-CM | POA: Diagnosis not present

## 2016-10-18 DIAGNOSIS — E288 Other ovarian dysfunction: Secondary | ICD-10-CM | POA: Diagnosis not present

## 2016-10-18 DIAGNOSIS — Z31430 Encounter of female for testing for genetic disease carrier status for procreative management: Secondary | ICD-10-CM | POA: Diagnosis not present

## 2016-10-18 DIAGNOSIS — D251 Intramural leiomyoma of uterus: Secondary | ICD-10-CM | POA: Diagnosis not present

## 2016-10-19 DIAGNOSIS — N8500 Endometrial hyperplasia, unspecified: Secondary | ICD-10-CM | POA: Diagnosis not present

## 2016-11-11 DIAGNOSIS — Z31430 Encounter of female for testing for genetic disease carrier status for procreative management: Secondary | ICD-10-CM | POA: Diagnosis not present

## 2016-11-11 DIAGNOSIS — Z113 Encounter for screening for infections with a predominantly sexual mode of transmission: Secondary | ICD-10-CM | POA: Diagnosis not present

## 2016-11-11 DIAGNOSIS — E288 Other ovarian dysfunction: Secondary | ICD-10-CM | POA: Diagnosis not present

## 2016-11-13 DIAGNOSIS — Z113 Encounter for screening for infections with a predominantly sexual mode of transmission: Secondary | ICD-10-CM | POA: Diagnosis not present

## 2016-11-13 DIAGNOSIS — Z31430 Encounter of female for testing for genetic disease carrier status for procreative management: Secondary | ICD-10-CM | POA: Diagnosis not present

## 2016-11-13 DIAGNOSIS — E288 Other ovarian dysfunction: Secondary | ICD-10-CM | POA: Diagnosis not present

## 2016-11-15 DIAGNOSIS — N8500 Endometrial hyperplasia, unspecified: Secondary | ICD-10-CM | POA: Diagnosis not present

## 2016-11-15 DIAGNOSIS — Z3141 Encounter for fertility testing: Secondary | ICD-10-CM | POA: Diagnosis not present

## 2016-12-28 DIAGNOSIS — Z3200 Encounter for pregnancy test, result unknown: Secondary | ICD-10-CM | POA: Diagnosis not present

## 2016-12-30 DIAGNOSIS — Z3201 Encounter for pregnancy test, result positive: Secondary | ICD-10-CM | POA: Diagnosis not present

## 2017-01-12 DIAGNOSIS — Z3200 Encounter for pregnancy test, result unknown: Secondary | ICD-10-CM | POA: Diagnosis not present

## 2017-01-16 DIAGNOSIS — O200 Threatened abortion: Secondary | ICD-10-CM | POA: Diagnosis not present

## 2017-01-26 DIAGNOSIS — O0900 Supervision of pregnancy with history of infertility, unspecified trimester: Secondary | ICD-10-CM | POA: Diagnosis not present

## 2017-01-28 DIAGNOSIS — O09511 Supervision of elderly primigravida, first trimester: Secondary | ICD-10-CM | POA: Diagnosis not present

## 2017-01-28 DIAGNOSIS — Z3A08 8 weeks gestation of pregnancy: Secondary | ICD-10-CM | POA: Diagnosis not present

## 2017-01-28 LAB — OB RESULTS CONSOLE HEPATITIS B SURFACE ANTIGEN: Hepatitis B Surface Ag: NEGATIVE

## 2017-01-28 LAB — OB RESULTS CONSOLE HIV ANTIBODY (ROUTINE TESTING): HIV: NONREACTIVE

## 2017-01-28 LAB — OB RESULTS CONSOLE RPR: RPR: NONREACTIVE

## 2017-01-28 LAB — OB RESULTS CONSOLE ANTIBODY SCREEN: ANTIBODY SCREEN: NEGATIVE

## 2017-01-28 LAB — OB RESULTS CONSOLE RUBELLA ANTIBODY, IGM: RUBELLA: IMMUNE

## 2017-01-28 LAB — OB RESULTS CONSOLE ABO/RH: RH Type: POSITIVE

## 2017-01-28 LAB — OB RESULTS CONSOLE GC/CHLAMYDIA
CHLAMYDIA, DNA PROBE: NEGATIVE
GC PROBE AMP, GENITAL: NEGATIVE

## 2017-02-04 DIAGNOSIS — Z113 Encounter for screening for infections with a predominantly sexual mode of transmission: Secondary | ICD-10-CM | POA: Diagnosis not present

## 2017-02-04 DIAGNOSIS — Z3A09 9 weeks gestation of pregnancy: Secondary | ICD-10-CM | POA: Diagnosis not present

## 2017-02-04 DIAGNOSIS — Z124 Encounter for screening for malignant neoplasm of cervix: Secondary | ICD-10-CM | POA: Diagnosis not present

## 2017-02-04 DIAGNOSIS — Z1151 Encounter for screening for human papillomavirus (HPV): Secondary | ICD-10-CM | POA: Diagnosis not present

## 2017-02-04 DIAGNOSIS — O09511 Supervision of elderly primigravida, first trimester: Secondary | ICD-10-CM | POA: Diagnosis not present

## 2017-03-01 DIAGNOSIS — Z3A13 13 weeks gestation of pregnancy: Secondary | ICD-10-CM | POA: Diagnosis not present

## 2017-03-01 DIAGNOSIS — O09511 Supervision of elderly primigravida, first trimester: Secondary | ICD-10-CM | POA: Diagnosis not present

## 2017-03-10 DIAGNOSIS — L218 Other seborrheic dermatitis: Secondary | ICD-10-CM | POA: Diagnosis not present

## 2017-03-21 DIAGNOSIS — K080 Exfoliation of teeth due to systemic causes: Secondary | ICD-10-CM | POA: Diagnosis not present

## 2017-03-25 DIAGNOSIS — Z361 Encounter for antenatal screening for raised alphafetoprotein level: Secondary | ICD-10-CM | POA: Diagnosis not present

## 2017-03-25 DIAGNOSIS — Z3A16 16 weeks gestation of pregnancy: Secondary | ICD-10-CM | POA: Diagnosis not present

## 2017-03-25 DIAGNOSIS — O09512 Supervision of elderly primigravida, second trimester: Secondary | ICD-10-CM | POA: Diagnosis not present

## 2017-04-13 DIAGNOSIS — Z3A19 19 weeks gestation of pregnancy: Secondary | ICD-10-CM | POA: Diagnosis not present

## 2017-04-13 DIAGNOSIS — O09512 Supervision of elderly primigravida, second trimester: Secondary | ICD-10-CM | POA: Diagnosis not present

## 2017-04-13 DIAGNOSIS — Z3686 Encounter for antenatal screening for cervical length: Secondary | ICD-10-CM | POA: Diagnosis not present

## 2017-04-29 DIAGNOSIS — O09512 Supervision of elderly primigravida, second trimester: Secondary | ICD-10-CM | POA: Diagnosis not present

## 2017-04-29 DIAGNOSIS — Z048 Encounter for examination and observation for other specified reasons: Secondary | ICD-10-CM | POA: Diagnosis not present

## 2017-04-29 DIAGNOSIS — Z3A21 21 weeks gestation of pregnancy: Secondary | ICD-10-CM | POA: Diagnosis not present

## 2017-04-29 DIAGNOSIS — Z363 Encounter for antenatal screening for malformations: Secondary | ICD-10-CM | POA: Diagnosis not present

## 2017-05-10 DIAGNOSIS — Z369 Encounter for antenatal screening, unspecified: Secondary | ICD-10-CM | POA: Diagnosis not present

## 2017-05-11 DIAGNOSIS — O99519 Diseases of the respiratory system complicating pregnancy, unspecified trimester: Secondary | ICD-10-CM | POA: Diagnosis not present

## 2017-05-11 DIAGNOSIS — J343 Hypertrophy of nasal turbinates: Secondary | ICD-10-CM | POA: Diagnosis not present

## 2017-05-11 DIAGNOSIS — J310 Chronic rhinitis: Secondary | ICD-10-CM | POA: Diagnosis not present

## 2017-05-17 DIAGNOSIS — Z3A24 24 weeks gestation of pregnancy: Secondary | ICD-10-CM | POA: Diagnosis not present

## 2017-05-17 DIAGNOSIS — O09512 Supervision of elderly primigravida, second trimester: Secondary | ICD-10-CM | POA: Diagnosis not present

## 2017-05-18 ENCOUNTER — Encounter (HOSPITAL_COMMUNITY): Payer: Self-pay | Admitting: *Deleted

## 2017-05-18 ENCOUNTER — Inpatient Hospital Stay (HOSPITAL_COMMUNITY)
Admission: AD | Admit: 2017-05-18 | Discharge: 2017-05-18 | Disposition: A | Discharge status: Home / Self Care | Payer: Federal, State, Local not specified - PPO | Source: Ambulatory Visit | Attending: Obstetrics and Gynecology | Admitting: Obstetrics and Gynecology

## 2017-05-18 DIAGNOSIS — Z79891 Long term (current) use of opiate analgesic: Secondary | ICD-10-CM | POA: Insufficient documentation

## 2017-05-18 DIAGNOSIS — Z331 Pregnant state, incidental: Secondary | ICD-10-CM

## 2017-05-18 DIAGNOSIS — Z79899 Other long term (current) drug therapy: Secondary | ICD-10-CM | POA: Diagnosis not present

## 2017-05-18 DIAGNOSIS — O99352 Diseases of the nervous system complicating pregnancy, second trimester: Secondary | ICD-10-CM | POA: Diagnosis not present

## 2017-05-18 DIAGNOSIS — O99342 Other mental disorders complicating pregnancy, second trimester: Secondary | ICD-10-CM | POA: Insufficient documentation

## 2017-05-18 DIAGNOSIS — F410 Panic disorder [episodic paroxysmal anxiety] without agoraphobia: Secondary | ICD-10-CM | POA: Diagnosis not present

## 2017-05-18 DIAGNOSIS — E785 Hyperlipidemia, unspecified: Secondary | ICD-10-CM | POA: Insufficient documentation

## 2017-05-18 DIAGNOSIS — Z3A24 24 weeks gestation of pregnancy: Secondary | ICD-10-CM | POA: Diagnosis not present

## 2017-05-18 DIAGNOSIS — Z9889 Other specified postprocedural states: Secondary | ICD-10-CM | POA: Diagnosis not present

## 2017-05-18 DIAGNOSIS — N856 Intrauterine synechiae: Secondary | ICD-10-CM | POA: Insufficient documentation

## 2017-05-18 DIAGNOSIS — O26892 Other specified pregnancy related conditions, second trimester: Secondary | ICD-10-CM | POA: Diagnosis not present

## 2017-05-18 DIAGNOSIS — O99282 Endocrine, nutritional and metabolic diseases complicating pregnancy, second trimester: Secondary | ICD-10-CM | POA: Diagnosis not present

## 2017-05-18 DIAGNOSIS — G4700 Insomnia, unspecified: Secondary | ICD-10-CM | POA: Diagnosis not present

## 2017-05-18 DIAGNOSIS — R51 Headache: Secondary | ICD-10-CM | POA: Diagnosis not present

## 2017-05-18 LAB — URINALYSIS, ROUTINE W REFLEX MICROSCOPIC
Bilirubin Urine: NEGATIVE
GLUCOSE, UA: NEGATIVE mg/dL
HGB URINE DIPSTICK: NEGATIVE
Ketones, ur: 20 mg/dL — AB
Leukocytes, UA: NEGATIVE
Nitrite: NEGATIVE
Protein, ur: NEGATIVE mg/dL
Specific Gravity, Urine: 1.026 (ref 1.005–1.030)
pH: 5 (ref 5.0–8.0)

## 2017-05-18 MED ORDER — LORAZEPAM 1 MG PO TABS
0.5000 mg | ORAL_TABLET | Freq: Once | ORAL | Status: AC
  Administered 2017-05-18: 0.5 mg via ORAL
  Filled 2017-05-18: qty 1

## 2017-05-18 NOTE — MAU Note (Signed)
Pt has had insomnia issues for the past two weeks, it has been worse since Wednesday.  Took an [place] around 2000 this past evening helped for about two hours.  Woke up hearing voices feeling panicked short of breath and heart racing.  Feels like "im going crazy" patient states she cannot keep going with no sleep.  Denies LOF/VB.

## 2017-05-18 NOTE — MAU Provider Note (Signed)
History     Chief Complaint  Patient presents with  . Insomnia  . Headache  HPI: 36 yo G1P0 MBF presents @ 24 4/[redacted] weeks gestation  with c/o insomnia and panic attack. Pt was seen yesterday For the same complaint and was given ambien for sleep. Per pt, she took the ambien at 8 pm and slept only for two hours woke up hearing working and then started having heart racing and feeling anxious.    OB History    Gravida Para Term Preterm AB Living   1             SAB TAB Ectopic Multiple Live Births                  Past Medical History:  Diagnosis Date  . Asherman's syndrome   . Eczema    Neck, behind ears and scalp  . Hyperlipidemia   . Wears contact lenses     Past Surgical History:  Procedure Laterality Date  . DX LAPAROSCOPY/  LAPAROTOMY MYOMECTOMY  2013  . HYSTEROSCOPY N/A 07/04/2014   Procedure: HYSTEROSCOPY WITH LYSIS OF INTER UTERINE ADHESIONS;  Surgeon: Governor Specking, MD;  Location: [facility];  Service: Gynecology;  Laterality: N/A;  . LAPAROSCOPY N/A 07/04/2014   Procedure: LAPAROSCOPY WITH EXTENSIVE LYSIS OF ADHESIONS, BILATERAL SALPINGO-OOPHOROLYSIS.;  Surgeon: Governor Specking, MD;  Location: [facility];  Service: Gynecology;  Laterality: N/A;    Family History  Problem Relation Age of Onset  . Adopted: Yes    Social History  Substance Use Topics  . Smoking status: Never Smoker  . Smokeless tobacco: Never Used  . Alcohol use No    Allergies: No Known Allergies  Prescriptions Prior to Admission  Medication Sig Dispense Refill Last Dose  . HYDROcodone-acetaminophen (NORCO/VICODIN) 5-325 MG per tablet Take 2 tablets by mouth every 4 (four) hours as needed. 10 tablet 0 02/17/2015  . Magnesium 400 MG CAPS Take by mouth.   02/17/2015  . Misc Natural Products (PROSTATE SUPPORT) 300-15 MG TABS Take 1 tablet by mouth daily.   02/17/2015  . Multiple Vitamins-Minerals (WOMENS MULTIVITAMIN PLUS) TABS Take by mouth.   02/17/2015  .  Omega-3 Fatty Acids (FISH OIL) 1000 MG CAPS One capsule by mouth with breakfast and dinner to help with cholesterol.  0 02/17/2015  . ondansetron (ZOFRAN) 4 MG tablet Take 1 tablet (4 mg total) by mouth every 8 (eight) hours as needed for nausea or vomiting. 20 tablet 0 02/17/2015  . Probiotic Product (PROBIOTIC DAILY PO) Take by mouth.   02/17/2015  . Zinc 30 MG CAPS Take by mouth.   02/17/2015     Physical Exam   Blood pressure 128/74, pulse (!) 102, temperature 97.7 F (36.5 C), temperature source Oral, resp. rate 17, last menstrual period 09/14/2016.  No exam performed today, not indicated. Tracing: baseline 150 NR c/w gestational age  ED Course  IMP: Insomnia Panic attack IUP @ 24 4/7 weeks P) ativan( 0.5mg ) x 1 given. Pt instructed to take another dose of ambien. Psychiatry referral( office to call pt with name of psychiatrist ). D/c home. oow note for remainder of the week only MDM   Airyn Ellzey A, MD 2:19 AM 05/18/2017

## 2017-05-18 NOTE — Progress Notes (Addendum)
Psychiatric Initial Adult Assessment   Patient Identification: Nicole Wiggins MRN:  [id number] Date of Evaluation:  05/19/2017 Referral Source: Self Chief Complaint:   Chief Complaint    Follow-up; Anxiety     Visit Diagnosis:    ICD-10-CM   1. Insomnia, unspecified type G47.00   2. Anxiety disorder, unspecified type F41.9     History of Present Illness:   Nicole Wiggins is a 36 year old [redacted] weeks pregnant female with Asherman's syndrome, hyperlipidemia, who is referred for insomnia.   Patient states that she is here due to insomnia. She states that she has not slept for the past two weeks. It started out with her nasal congestion interfering her sleep. She then started to feel anxious about not being able to sleep. Although she slept for a few hours on Ambien 5 mg, she could not slept the next night. She went to [facility] and was prescribed ativan 0.5 mg. Although she slept for a few hours that night feeling calmer, she had insomnia again. She tried Ambien 10 mg with very little benefit. She feels very overwhelmed concerning that how it might affect her baby. She has tried for long to have baby and is concerned about its consequences. She denies any mood symptoms before pregnancy, stating that she was doing very well. She now feels jittery, tense and has decreased appetite. She missed work this week due to her condition.   She denies feeling depressed. She denies SI. She had AH while on Ambien. She denies VH. She denies decreased need for sleep or euphoria. She has panic attacks a few times. She denies drug use or alcohol use. She was checked TSH last year; wnl per self report. Although she snores at night since pregnancy, she states that she has not had insomnia until two weeks ago.   Her husband presents to the interview.  He agrees to what she says during the interview. He states that she appears to be anxious about how this condition affects her baby.   Associated Signs/Symptoms: Depression  Symptoms:  depressed mood, anhedonia, insomnia, fatigue, anxiety, panic attacks, (Hypo) Manic Symptoms:  denies Anxiety Symptoms:  Excessive Worry, Panic Symptoms, Psychotic Symptoms:  voice when she takes [place] PTSD Symptoms: denies  Past Psychiatric History:  Outpatient: denies Psychiatry admission: denies Previous suicide attempt: denies Past trials of medication: Unisom, Ambien, tylenol PM, benadryl History of violence: denies  Previous Psychotropic Medications: No   Substance Abuse History in the last 12 months:  No.  Consequences of Substance Abuse: NA  Past Medical History:  Past Medical History:  Diagnosis Date  . Asherman's syndrome   . Eczema    Neck, behind ears and scalp  . Hyperlipidemia   . Wears contact lenses     Past Surgical History:  Procedure Laterality Date  . DX LAPAROSCOPY/  LAPAROTOMY MYOMECTOMY  2013  . HYSTEROSCOPY N/A 07/04/2014   Procedure: HYSTEROSCOPY WITH LYSIS OF INTER UTERINE ADHESIONS;  Surgeon: Governor Specking, MD;  Location: [facility];  Service: Gynecology;  Laterality: N/A;  . LAPAROSCOPY N/A 07/04/2014   Procedure: LAPAROSCOPY WITH EXTENSIVE LYSIS OF ADHESIONS, BILATERAL SALPINGO-OOPHOROLYSIS.;  Surgeon: Governor Specking, MD;  Location: [facility];  Service: Gynecology;  Laterality: N/A;    Family Psychiatric History:  Mother- depression, alcohol use,   Family History:  Family History  Problem Relation Age of Onset  . Adopted: Yes    Social History:   Social History   Social History  . Marital status: Married  Spouse name: N/A  . Number of children: N/A  . Years of education: N/A   Social History Main Topics  . Smoking status: Never Smoker  . Smokeless tobacco: Never Used  . Alcohol use No  . Drug use: No  . Sexual activity: Yes   Other Topics Concern  . 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[employer])     Education:  Master's Degree    Tobacco Use/Exposure:  None    Alcohol Use:  Occasional   Drug Use:  None   Diet:  Regular   Exercise:  Gym (cardio) - 3-4 times per week     Hobbies:  Reading and Music                Additional Social History:  Lives with her husband of five years, no children,  Work: Optometrist for 14 years She grew up in [place], reports "fine" childhood, good relationship with her mother, but not with her father  Allergies:  No Known Allergies  Metabolic Disorder Labs: No results found for: HGBA1C, MPG No results found for: PROLACTIN Lab Results  Component Value Date   CHOL 252 (H) 11/01/2014   TRIG 46 11/01/2014   HDL 87 11/01/2014   CHOLHDL 2.9 11/01/2014   VLDL 9 11/01/2014   LDLCALC 156 (H) 11/01/2014   LDLCALC 117 (H) 10/15/2013     Current Medications: Current Outpatient Prescriptions  Medication Sig Dispense Refill  . LORazepam (ATIVAN) 0.5 MG tablet Take 1 tablet (0.5 mg total) by mouth daily as needed for anxiety. 30 tablet 0  . Magnesium 400 MG CAPS Take by mouth.    . Multiple Vitamins-Minerals (WOMENS MULTIVITAMIN PLUS) TABS Take by mouth.    . Probiotic Product (PROBIOTIC DAILY PO) Take by mouth.    . sertraline (ZOLOFT) 25 MG tablet Take 1 tablet (25 mg total) by mouth daily. 30 tablet 1  . Zinc 30 MG CAPS Take by mouth.     No current facility-administered medications for this visit.     Neurologic: Headache: No Seizure: No Paresthesias:No  Musculoskeletal: Strength & Muscle Tone: within normal limits Gait & Station: normal Patient leans: N/A  Psychiatric Specialty Exam: Review of Systems  Psychiatric/Behavioral: Negative for depression, hallucinations, substance abuse and suicidal ideas. The patient is nervous/anxious and has insomnia.   All other systems reviewed and are negative.   Blood pressure 118/77, pulse 98, height 5\' 5"  (1.651 m),  weight 198 lb (89.8 kg), last menstrual period 09/14/2016.Body mass index is 32.95 kg/m.  General Appearance: Fairly Groomed  Eye Contact:  Good  Speech:  Clear and Coherent  Volume:  Normal  Mood:  Anxious  Affect:  Appropriate, Congruent, Tearful and tense, becomes calmer  Thought Process:  Coherent and Goal Directed  Orientation:  Full (Time, Place, and Person)  Thought Content:  Logical Perceptions: denies AH/VH  Suicidal Thoughts:  No  Homicidal Thoughts:  No  Memory:  Immediate;   Good Recent;   Good Remote;   Good  Judgement:  Good  Insight:  Fair  Psychomotor Activity:  Normal  Concentration:  Concentration: Good and Attention Span: Good  Recall:  Good  Fund of Knowledge:Good  Language: Good  Akathisia:  No  Handed:  Ambidextrous  AIMS (if indicated):  N/A  Assets:  Communication Skills  Desire for Improvement  ADL's:  Intact  Cognition: WNL  Sleep:  poor   Assessment Jerine Urquiza is a 36 year old [redacted] weeks pregnant female with Asherman's syndrome, hyperlipidemia, who is referred for insomnia.   # Insomnia # Unspecified anxiety disorder Exam is notable for significant anxiety, tearfulness in the setting of insomnia and pregnancy. Cause of insomnia is multifactorial; anxiety, nasal congestion, concern for sleep apnea. Discussed sleep hygiene. Will start sertraline to target anxiety. Discussed risk of SSRI during pregnancy, which includes but not limited to Teratogenesis, Spontaneous abortion, Cardiovascular malformation. Will also start ativan prn for anxiety for short term given severity of her symptoms. Risk to fetus discussed especially with use of sertraline. She will greatly benefit from supportive therapy/CBT; she is scheduled to see a [facility] at [facility].  Plan 1. Start sertraline 25 mg daily 2. Start lorazepam 0.5 mg daily as needed for anxiety  3. Discontinue Ambien 3. Return to clinic in one month for 30 mins  4. Obtain record form Dr. Garwin Brothers at [place]: she is hoping to go to [facility] and does not make follow up appointment.   The patient demonstrates the following risk factors for suicide: Chronic risk factors for suicide include: psychiatric disorder of anxiety, insomnia. Acute risk factors for suicide include: N/A. Protective factors for this patient include: positive social support, coping skills and hope for the future. Considering these factors, the overall suicide risk at this point appears to be low. Patient is appropriate for outpatient follow up.   Treatment Plan Summary: Plan as above   Norman Clay, MD 9/13/20183:45 PM

## 2017-05-18 NOTE — Discharge Instructions (Signed)
Increase dose to ambien 10mg  po qhs prn.  Increase po fluid intake

## 2017-05-19 ENCOUNTER — Ambulatory Visit (INDEPENDENT_AMBULATORY_CARE_PROVIDER_SITE_OTHER): Payer: Federal, State, Local not specified - PPO | Admitting: Psychiatry

## 2017-05-19 ENCOUNTER — Encounter (HOSPITAL_COMMUNITY): Payer: Self-pay | Admitting: Psychiatry

## 2017-05-19 VITALS — BP 118/77 | HR 98 | Ht 65.0 in | Wt 198.0 lb

## 2017-05-19 DIAGNOSIS — R450 Nervousness: Secondary | ICD-10-CM | POA: Diagnosis not present

## 2017-05-19 DIAGNOSIS — Z818 Family history of other mental and behavioral disorders: Secondary | ICD-10-CM | POA: Diagnosis not present

## 2017-05-19 DIAGNOSIS — F419 Anxiety disorder, unspecified: Secondary | ICD-10-CM

## 2017-05-19 DIAGNOSIS — Z811 Family history of alcohol abuse and dependence: Secondary | ICD-10-CM

## 2017-05-19 DIAGNOSIS — Z3A24 24 weeks gestation of pregnancy: Secondary | ICD-10-CM

## 2017-05-19 DIAGNOSIS — N856 Intrauterine synechiae: Secondary | ICD-10-CM

## 2017-05-19 DIAGNOSIS — O2692 Pregnancy related conditions, unspecified, second trimester: Secondary | ICD-10-CM | POA: Diagnosis not present

## 2017-05-19 DIAGNOSIS — E785 Hyperlipidemia, unspecified: Secondary | ICD-10-CM | POA: Diagnosis not present

## 2017-05-19 DIAGNOSIS — G4700 Insomnia, unspecified: Secondary | ICD-10-CM

## 2017-05-19 MED ORDER — LORAZEPAM 0.5 MG PO TABS: 0.5000 mg | ORAL_TABLET | Freq: Every day | ORAL | 0 refills | Status: DC | PRN

## 2017-05-19 MED ORDER — SERTRALINE HCL 25 MG PO TABS: 25.0000 mg | ORAL_TABLET | Freq: Every day | ORAL | 1 refills | Status: DC

## 2017-05-19 NOTE — Patient Instructions (Signed)
1. Start sertraline 25 mg daily 2. Start lorazepam 0.5 mg daily as needed for anxiety  3. Return to clinic in one month for 30 mins

## 2017-05-26 DIAGNOSIS — Z3A25 25 weeks gestation of pregnancy: Secondary | ICD-10-CM | POA: Diagnosis not present

## 2017-05-26 DIAGNOSIS — O09512 Supervision of elderly primigravida, second trimester: Secondary | ICD-10-CM | POA: Diagnosis not present

## 2017-05-30 ENCOUNTER — Telehealth (HOSPITAL_COMMUNITY): Payer: Self-pay | Admitting: *Deleted

## 2017-05-30 NOTE — Telephone Encounter (Signed)
If sertraline makes her feel more anxious, would advise to discontinue the medication. Would not prescribe ativan without evaluation at this time given she takes more frequently than instructed. Ask if she can come here for follow up appointment. If she plans to see other provider, will defer further management to the new provider.

## 2017-05-30 NOTE — Telephone Encounter (Signed)
Called pt to inform her with what provider stated. Per pt she will call back tomorrow to make up her mind if she wants to make a f/u appt to see provider about her Ativan.

## 2017-05-30 NOTE — Telephone Encounter (Signed)
Pt called stating her medication is not working. Per pt she would like to know if she could come off her Zoloft. Per pt the Zoloft is making her more anxious during the day. Per pt she takes the Ativan when she goes to sleep because that's when she actually gets anxious. Per pt she out of her Ativan and she don't know what Dr. Modesta Messing wants her to do about that. Per pt chart, pt should not be out of her Ativan due to script being printed on 05-19-2017 with 30 tabs. Per pt she found another provider that is closer to her home and she have an appt with them soon. Pt number is [phone]. Staff informed pt with information about her not being out of her Ativan if she is taking the Ativan as prescribed. Per pt she have been taking 2 of the Ativan most days with 1 in Afternoon and 1 at night. Per pt she did this on days she did not take her Zoloft because she did not like the way it made her feel.

## 2017-05-31 DIAGNOSIS — F411 Generalized anxiety disorder: Secondary | ICD-10-CM | POA: Diagnosis not present

## 2017-06-01 DIAGNOSIS — F411 Generalized anxiety disorder: Secondary | ICD-10-CM | POA: Diagnosis not present

## 2017-06-03 DIAGNOSIS — F411 Generalized anxiety disorder: Secondary | ICD-10-CM | POA: Diagnosis not present

## 2017-06-06 ENCOUNTER — Inpatient Hospital Stay (HOSPITAL_COMMUNITY)
Admission: AD | Admit: 2017-06-06 | Discharge: 2017-06-07 | Disposition: A | Discharge status: Home / Self Care | Payer: Federal, State, Local not specified - PPO | Source: Ambulatory Visit | Attending: Obstetrics and Gynecology | Admitting: Obstetrics and Gynecology

## 2017-06-06 ENCOUNTER — Encounter (HOSPITAL_COMMUNITY): Payer: Self-pay

## 2017-06-06 DIAGNOSIS — O99343 Other mental disorders complicating pregnancy, third trimester: Secondary | ICD-10-CM | POA: Diagnosis not present

## 2017-06-06 DIAGNOSIS — Z3A29 29 weeks gestation of pregnancy: Secondary | ICD-10-CM | POA: Insufficient documentation

## 2017-06-06 DIAGNOSIS — T50905A Adverse effect of unspecified drugs, medicaments and biological substances, initial encounter: Secondary | ICD-10-CM

## 2017-06-06 DIAGNOSIS — R079 Chest pain, unspecified: Secondary | ICD-10-CM | POA: Insufficient documentation

## 2017-06-06 DIAGNOSIS — Z79899 Other long term (current) drug therapy: Secondary | ICD-10-CM | POA: Diagnosis not present

## 2017-06-06 DIAGNOSIS — G4700 Insomnia, unspecified: Secondary | ICD-10-CM

## 2017-06-06 DIAGNOSIS — O26893 Other specified pregnancy related conditions, third trimester: Secondary | ICD-10-CM | POA: Insufficient documentation

## 2017-06-06 DIAGNOSIS — F419 Anxiety disorder, unspecified: Secondary | ICD-10-CM

## 2017-06-06 DIAGNOSIS — F411 Generalized anxiety disorder: Secondary | ICD-10-CM | POA: Diagnosis not present

## 2017-06-06 HISTORY — DX: Benign neoplasm of connective and other soft tissue, unspecified: D21.9

## 2017-06-06 HISTORY — DX: Anxiety disorder, unspecified: F41.9

## 2017-06-06 LAB — URINALYSIS, ROUTINE W REFLEX MICROSCOPIC
Bilirubin Urine: NEGATIVE
GLUCOSE, UA: NEGATIVE mg/dL
HGB URINE DIPSTICK: NEGATIVE
Ketones, ur: NEGATIVE mg/dL
Leukocytes, UA: NEGATIVE
Nitrite: NEGATIVE
PROTEIN: NEGATIVE mg/dL
Specific Gravity, Urine: 1.002 — ABNORMAL LOW (ref 1.005–1.030)
pH: 7 (ref 5.0–8.0)

## 2017-06-06 LAB — CBC
HCT: 33.8 % — ABNORMAL LOW (ref 36.0–46.0)
Hemoglobin: 11.1 g/dL — ABNORMAL LOW (ref 12.0–15.0)
MCH: 27.8 pg (ref 26.0–34.0)
MCHC: 32.8 g/dL (ref 30.0–36.0)
MCV: 84.7 fL (ref 78.0–100.0)
PLATELETS: 235 10*3/uL (ref 150–400)
RBC: 3.99 MIL/uL (ref 3.87–5.11)
RDW: 14 % (ref 11.5–15.5)
WBC: 13.1 10*3/uL — AB (ref 4.0–10.5)

## 2017-06-06 LAB — COMPREHENSIVE METABOLIC PANEL
ALBUMIN: 3.2 g/dL — AB (ref 3.5–5.0)
ALT: 24 U/L (ref 14–54)
AST: 21 U/L (ref 15–41)
Alkaline Phosphatase: 52 U/L (ref 38–126)
Anion gap: 11 (ref 5–15)
BUN: 10 mg/dL (ref 6–20)
CO2: 21 mmol/L — AB (ref 22–32)
CREATININE: 0.85 mg/dL (ref 0.44–1.00)
Calcium: 9.6 mg/dL (ref 8.9–10.3)
Chloride: 103 mmol/L (ref 101–111)
GFR calc Af Amer: >60 mL/min (ref >60)
GFR calc non Af Amer: >60 mL/min (ref >60)
GLUCOSE: 101 mg/dL — AB (ref 65–99)
POTASSIUM: 3.5 mmol/L (ref 3.5–5.1)
SODIUM: 135 mmol/L (ref 135–145)
Total Bilirubin: 0.4 mg/dL (ref 0.3–1.2)
Total Protein: 7.4 g/dL (ref 6.5–8.1)

## 2017-06-06 LAB — PROTEIN / CREATININE RATIO, URINE
CREATININE, URINE: 26 mg/dL
Total Protein, Urine: <6 mg/dL

## 2017-06-06 LAB — TROPONIN I

## 2017-06-06 NOTE — MAU Note (Addendum)
Pt presents to MAU with c/o intermittent chest pain and tightening that started after taking new medication (Celexa) at 6 pm. Pt has had nausea with no vomiting and dizziness. Pt denies any jaw pain, epigastric pain numbness. +FM

## 2017-06-07 DIAGNOSIS — Z3A29 29 weeks gestation of pregnancy: Secondary | ICD-10-CM | POA: Diagnosis not present

## 2017-06-07 DIAGNOSIS — O99343 Other mental disorders complicating pregnancy, third trimester: Secondary | ICD-10-CM

## 2017-06-07 DIAGNOSIS — F411 Generalized anxiety disorder: Secondary | ICD-10-CM | POA: Diagnosis not present

## 2017-06-07 DIAGNOSIS — G4700 Insomnia, unspecified: Secondary | ICD-10-CM | POA: Diagnosis not present

## 2017-06-07 DIAGNOSIS — F419 Anxiety disorder, unspecified: Secondary | ICD-10-CM | POA: Diagnosis not present

## 2017-06-07 MED ORDER — HYDROXYZINE PAMOATE 25 MG PO CAPS: 25.0000 mg | ORAL_CAPSULE | Freq: Three times a day (TID) | ORAL | 0 refills | Status: DC | PRN

## 2017-06-07 NOTE — MAU Provider Note (Signed)
Chief Complaint:  Chest Pain   First Provider Initiated Contact with Patient 06/06/17 2217      HPI: Nicole Wiggins is a 36 y.o. G1P0 at [redacted]w[redacted]d who presents to maternity admissions reporting reaction to new medication started today for anxiety.  She took first dose of Celexa today at 6 pm and shortly afterwards felt jittery and had chest tightness and pain. She reports recent treatments for her insomnia and anxiety include Ambien, ativan, Zoloft and Celexa and none have helped much.  She sleeps better with Ambien but not well. She is also seeing a psychologist, who prescribed the Celexa, and has follow up appointment this week.  There are no associated symptoms and no obstetric symptoms are reported. She denies any suicidal thoughts or thoughts of self harm. She reports good fetal movement, denies abdominal pain, LOF, vaginal bleeding, vaginal itching/burning, urinary symptoms, h/a, dizziness, n/v, or fever/chills.    HPI  Past Medical History: Past Medical History:  Diagnosis Date  . Anxiety   . Asherman's syndrome   . Eczema    Neck, behind ears and scalp  . Fibroid   . Hyperlipidemia   . Wears contact lenses     Past obstetric history: OB History  Gravida Para Term Preterm AB Living  1            SAB TAB Ectopic Multiple Live Births               # Outcome Date GA Lbr Len/2nd Weight Sex Delivery Anes PTL Lv  1 Current               Past Surgical History: Past Surgical History:  Procedure Laterality Date  . DX LAPAROSCOPY/  LAPAROTOMY MYOMECTOMY  2013  . HYSTEROSCOPY N/A 07/04/2014   Procedure: HYSTEROSCOPY WITH LYSIS OF INTER UTERINE ADHESIONS;  Surgeon: Governor Specking, MD;  Location: [facility];  Service: Gynecology;  Laterality: N/A;  . LAPAROSCOPY N/A 07/04/2014   Procedure: LAPAROSCOPY WITH EXTENSIVE LYSIS OF ADHESIONS, BILATERAL SALPINGO-OOPHOROLYSIS.;  Surgeon: Governor Specking, MD;  Location: [facility];  Service: Gynecology;   Laterality: N/A;    Family History: Family History  Problem Relation Age of Onset  . Adopted: Yes    Social History: Social History  Substance Use Topics  . Smoking status: Never Smoker  . Smokeless tobacco: Never Used  . Alcohol use No    Allergies: No Known Allergies  Meds:  Prescriptions Prior to Admission  Medication Sig Dispense Refill Last Dose  . citalopram (CELEXA) 10 MG tablet Take 10 mg by mouth daily.   06/06/2017 at 1800  . LORazepam (ATIVAN) 0.5 MG tablet Take 1 tablet (0.5 mg total) by mouth daily as needed for anxiety. 30 tablet 0 Past Week at Unknown time  . Magnesium 400 MG CAPS Take by mouth.   Past Month at Unknown time  . Multiple Vitamins-Minerals (WOMENS MULTIVITAMIN PLUS) TABS Take by mouth.   06/06/2017 at Unknown time  . Probiotic Product (PROBIOTIC DAILY PO) Take by mouth.   Past Month at Unknown time  . sertraline (ZOLOFT) 25 MG tablet Take 1 tablet (25 mg total) by mouth daily. 30 tablet 1 Past Month at Unknown time  . Zinc 30 MG CAPS Take by mouth.   Past Month at Unknown time    ROS:  Review of Systems  Constitutional: Negative for chills, fatigue and fever.  Eyes: Negative for visual disturbance.  Respiratory: Positive for chest tightness. Negative for shortness of breath.  Cardiovascular: Positive for chest pain and palpitations.  Gastrointestinal: Negative for abdominal pain, nausea and vomiting.  Genitourinary: Negative for difficulty urinating, dysuria, flank pain, pelvic pain, vaginal bleeding, vaginal discharge and vaginal pain.  Neurological: Positive for light-headedness. Negative for dizziness and headaches.  Psychiatric/Behavioral: Positive for sleep disturbance. Negative for self-injury and suicidal ideas. The patient is nervous/anxious.      I have reviewed patient's Past Medical Hx, Surgical Hx, Family Hx, Social Hx, medications and allergies.   Physical Exam  Patient Vitals for the past 24 hrs:  BP Temp Pulse Resp SpO2   06/06/17 2346 130/73 - 82 - -  06/06/17 2331 123/73 - 81 - -  06/06/17 2301 120/61 - 88 - -  06/06/17 2246 124/64 - 85 - 96 %  06/06/17 2231 124/72 - 84 - -  06/06/17 2216 105/90 - 87 - -  06/06/17 2206 (!) 141/75 - 100 - -  06/06/17 2154 (!) 147/82 98.4 F (36.9 C) (!) 101 20 -  06/06/17 2152 (!) 147/82 - (!) 105 - -   Constitutional: Well-developed, well-nourished female in moderate distress.  Cardiovascular: normal rate Respiratory: normal effort GI: Abd soft, non-tender, gravid appropriate for gestational age.  MS: Extremities nontender, no edema, normal ROM Neurologic: Alert and oriented x 4.  GU: Neg CVAT.  PELVIC EXAM: Deferred    FHT:  Baseline 140, moderate variability, accelerations present, no decelerations Contractions: None on toco or to palpation   Labs: Results for orders placed or performed during the hospital encounter of 06/06/17 (from the past 24 hour(s))  Urinalysis, Routine w reflex microscopic     Status: Abnormal   Collection Time: 06/06/17  9:39 PM  Result Value Ref Range   Color, Urine COLORLESS (A) YELLOW   APPearance CLEAR CLEAR   Specific Gravity, Urine 1.002 (L) 1.005 - 1.030   pH 7.0 5.0 - 8.0   Glucose, UA NEGATIVE NEGATIVE mg/dL   Hgb urine dipstick NEGATIVE NEGATIVE   Bilirubin Urine NEGATIVE NEGATIVE   Ketones, ur NEGATIVE NEGATIVE mg/dL   Protein, ur NEGATIVE NEGATIVE mg/dL   Nitrite NEGATIVE NEGATIVE   Leukocytes, UA NEGATIVE NEGATIVE  Protein / creatinine ratio, urine     Status: None   Collection Time: 06/06/17  9:39 PM  Result Value Ref Range   Creatinine, Urine 26.00 mg/dL   Total Protein, Urine <6 mg/dL   Protein Creatinine Ratio        0.00 - 0.15 mg/mg[Cre]  CBC     Status: Abnormal   Collection Time: 06/06/17 10:28 PM  Result Value Ref Range   WBC 13.1 (H) 4.0 - 10.5 K/uL   RBC 3.99 3.87 - 5.11 MIL/uL   Hemoglobin 11.1 (L) 12.0 - 15.0 g/dL   HCT 33.8 (L) 36.0 - 46.0 %   MCV 84.7 78.0 - 100.0 fL   MCH 27.8 26.0 - 34.0  pg   MCHC 32.8 30.0 - 36.0 g/dL   RDW 14.0 11.5 - 15.5 %   Platelets 235 150 - 400 K/uL  Comprehensive metabolic panel     Status: Abnormal   Collection Time: 06/06/17 10:28 PM  Result Value Ref Range   Sodium 135 135 - 145 mmol/L   Potassium 3.5 3.5 - 5.1 mmol/L   Chloride 103 101 - 111 mmol/L   CO2 21 (L) 22 - 32 mmol/L   Glucose, Bld 101 (H) 65 - 99 mg/dL   BUN 10 6 - 20 mg/dL   Creatinine, Ser 0.85 0.44 - 1.00 mg/dL  Calcium 9.6 8.9 - 10.3 mg/dL   Total Protein 7.4 6.5 - 8.1 g/dL   Albumin 3.2 (L) 3.5 - 5.0 g/dL   AST 21 15 - 41 U/L   ALT 24 14 - 54 U/L   Alkaline Phosphatase 52 38 - 126 U/L   Total Bilirubin 0.4 0.3 - 1.2 mg/dL   GFR calc non Af Amer >60 >60 mL/min   GFR calc Af Amer >60 >60 mL/min   Anion gap 11 5 - 15  Troponin I     Status: None   Collection Time: 06/06/17 10:28 PM  Result Value Ref Range   Troponin I <0.03 <0.03 ng/mL     EKG: normal EKG, normal sinus rhythm  Imaging:  No results found.  MAU Course/MDM: I have ordered labs and reviewed results.  NST reviewed and reactive No evidence of cardiac problem with normal EKG and negative troponin CBC, CMP wnl Likely side effects of Celexa causing anxiety for pt Consult Dr Murrell Redden with presentation, exam findings and test results.  Stop Celexa, add Vistaril 25 mg TID PRN Pt to call [facility] and schedule appt this week for follow up Keep scheduled appointment with psychologist tomorrow Pt stable at time of discharge.   Assessment: 1. Insomnia, unspecified type   2. Anxiety during pregnancy in third trimester, antepartum   3. Medication side effect, initial encounter     Plan: Discharge home Seek help immediately if thoughts of self harm or suicide  Follow-up Information    Obgyn, [facility] Follow up.   Why:  Call to make appointment this week.  Return to MAU as needed for emergencies. Contact information: [place] [id number] [phone]          Allergies as  of 06/07/2017   No Known Allergies     Medication List    STOP taking these medications   citalopram 10 MG tablet Commonly known as:  CELEXA   sertraline 25 MG tablet Commonly known as:  ZOLOFT     TAKE these medications   hydrOXYzine 25 MG capsule Commonly known as:  VISTARIL Take 1 capsule (25 mg total) by mouth 3 (three) times daily as needed.   LORazepam 0.5 MG tablet Commonly known as:  ATIVAN Take 1 tablet (0.5 mg total) by mouth daily as needed for anxiety.   Magnesium 400 MG Caps Take by mouth.   PROBIOTIC DAILY PO Take by mouth.   WOMENS MULTIVITAMIN PLUS Tabs Take by mouth.   Zinc 30 MG Caps Take by mouth.       Fatima Blank Certified Nurse-Midwife 06/07/2017 12:13 AM

## 2017-06-09 DIAGNOSIS — O09512 Supervision of elderly primigravida, second trimester: Secondary | ICD-10-CM | POA: Diagnosis not present

## 2017-06-09 DIAGNOSIS — Z3A27 27 weeks gestation of pregnancy: Secondary | ICD-10-CM | POA: Diagnosis not present

## 2017-06-10 DIAGNOSIS — G4700 Insomnia, unspecified: Secondary | ICD-10-CM | POA: Diagnosis not present

## 2017-06-10 DIAGNOSIS — F411 Generalized anxiety disorder: Secondary | ICD-10-CM | POA: Diagnosis not present

## 2017-06-13 DIAGNOSIS — Z23 Encounter for immunization: Secondary | ICD-10-CM | POA: Diagnosis not present

## 2017-06-13 DIAGNOSIS — O3663X0 Maternal care for excessive fetal growth, third trimester, not applicable or unspecified: Secondary | ICD-10-CM | POA: Diagnosis not present

## 2017-06-13 DIAGNOSIS — Z3A28 28 weeks gestation of pregnancy: Secondary | ICD-10-CM | POA: Diagnosis not present

## 2017-06-13 DIAGNOSIS — Z3689 Encounter for other specified antenatal screening: Secondary | ICD-10-CM | POA: Diagnosis not present

## 2017-06-14 ENCOUNTER — Telehealth (HOSPITAL_COMMUNITY): Payer: Self-pay | Admitting: Psychiatry

## 2017-06-14 NOTE — Telephone Encounter (Signed)
Per notification from [facility], patient is reportedly on both citalopram and sertraline. Will verify this with patient.

## 2017-06-22 DIAGNOSIS — F411 Generalized anxiety disorder: Secondary | ICD-10-CM | POA: Diagnosis not present

## 2017-06-22 DIAGNOSIS — G4700 Insomnia, unspecified: Secondary | ICD-10-CM | POA: Diagnosis not present

## 2017-06-24 DIAGNOSIS — O368130 Decreased fetal movements, third trimester, not applicable or unspecified: Secondary | ICD-10-CM | POA: Diagnosis not present

## 2017-06-24 DIAGNOSIS — Z3A29 29 weeks gestation of pregnancy: Secondary | ICD-10-CM | POA: Diagnosis not present

## 2017-06-29 DIAGNOSIS — G4700 Insomnia, unspecified: Secondary | ICD-10-CM | POA: Diagnosis not present

## 2017-06-29 DIAGNOSIS — Z23 Encounter for immunization: Secondary | ICD-10-CM | POA: Diagnosis not present

## 2017-06-29 DIAGNOSIS — Z3A30 30 weeks gestation of pregnancy: Secondary | ICD-10-CM | POA: Diagnosis not present

## 2017-06-29 DIAGNOSIS — F411 Generalized anxiety disorder: Secondary | ICD-10-CM | POA: Diagnosis not present

## 2017-06-29 DIAGNOSIS — O3413 Maternal care for benign tumor of corpus uteri, third trimester: Secondary | ICD-10-CM | POA: Diagnosis not present

## 2017-07-06 DIAGNOSIS — F411 Generalized anxiety disorder: Secondary | ICD-10-CM | POA: Diagnosis not present

## 2017-07-06 DIAGNOSIS — G4700 Insomnia, unspecified: Secondary | ICD-10-CM | POA: Diagnosis not present

## 2017-07-15 DIAGNOSIS — O3413 Maternal care for benign tumor of corpus uteri, third trimester: Secondary | ICD-10-CM | POA: Diagnosis not present

## 2017-07-15 DIAGNOSIS — O3663X0 Maternal care for excessive fetal growth, third trimester, not applicable or unspecified: Secondary | ICD-10-CM | POA: Diagnosis not present

## 2017-07-15 DIAGNOSIS — Z3A32 32 weeks gestation of pregnancy: Secondary | ICD-10-CM | POA: Diagnosis not present

## 2017-07-22 DIAGNOSIS — G4700 Insomnia, unspecified: Secondary | ICD-10-CM | POA: Diagnosis not present

## 2017-07-22 DIAGNOSIS — F411 Generalized anxiety disorder: Secondary | ICD-10-CM | POA: Diagnosis not present

## 2017-07-26 ENCOUNTER — Other Ambulatory Visit: Payer: Self-pay | Admitting: Obstetrics and Gynecology

## 2017-07-26 DIAGNOSIS — Z3A34 34 weeks gestation of pregnancy: Secondary | ICD-10-CM | POA: Diagnosis not present

## 2017-07-26 DIAGNOSIS — O3413 Maternal care for benign tumor of corpus uteri, third trimester: Secondary | ICD-10-CM | POA: Diagnosis not present

## 2017-07-26 DIAGNOSIS — O3663X0 Maternal care for excessive fetal growth, third trimester, not applicable or unspecified: Secondary | ICD-10-CM | POA: Diagnosis not present

## 2017-08-01 DIAGNOSIS — F411 Generalized anxiety disorder: Secondary | ICD-10-CM | POA: Diagnosis not present

## 2017-08-01 DIAGNOSIS — G4700 Insomnia, unspecified: Secondary | ICD-10-CM | POA: Diagnosis not present

## 2017-08-02 DIAGNOSIS — Z3A35 35 weeks gestation of pregnancy: Secondary | ICD-10-CM | POA: Diagnosis not present

## 2017-08-02 DIAGNOSIS — O3663X0 Maternal care for excessive fetal growth, third trimester, not applicable or unspecified: Secondary | ICD-10-CM | POA: Diagnosis not present

## 2017-08-02 DIAGNOSIS — Z3685 Encounter for antenatal screening for Streptococcus B: Secondary | ICD-10-CM | POA: Diagnosis not present

## 2017-08-03 DIAGNOSIS — Z3A35 35 weeks gestation of pregnancy: Secondary | ICD-10-CM | POA: Diagnosis not present

## 2017-08-05 ENCOUNTER — Encounter (HOSPITAL_COMMUNITY): Payer: Self-pay

## 2017-08-06 ENCOUNTER — Encounter (HOSPITAL_COMMUNITY): Payer: Self-pay | Admitting: *Deleted

## 2017-08-06 ENCOUNTER — Inpatient Hospital Stay (HOSPITAL_COMMUNITY): Payer: Federal, State, Local not specified - PPO | Admitting: Anesthesiology

## 2017-08-06 ENCOUNTER — Inpatient Hospital Stay (HOSPITAL_COMMUNITY)
Admission: AD | Admit: 2017-08-06 | Discharge: 2017-08-09 | DRG: 788 | Disposition: A | Discharge status: Home / Self Care | Payer: Federal, State, Local not specified - PPO | Source: Ambulatory Visit | Attending: Obstetrics and Gynecology | Admitting: Obstetrics and Gynecology

## 2017-08-06 ENCOUNTER — Inpatient Hospital Stay (HOSPITAL_COMMUNITY): Payer: Federal, State, Local not specified - PPO

## 2017-08-06 ENCOUNTER — Encounter (HOSPITAL_COMMUNITY)
Admission: AD | Disposition: A | Discharge status: Home / Self Care | Payer: Self-pay | Source: Ambulatory Visit | Attending: Obstetrics and Gynecology

## 2017-08-06 ENCOUNTER — Other Ambulatory Visit: Payer: Self-pay

## 2017-08-06 DIAGNOSIS — K219 Gastro-esophageal reflux disease without esophagitis: Secondary | ICD-10-CM | POA: Diagnosis not present

## 2017-08-06 DIAGNOSIS — O34211 Maternal care for low transverse scar from previous cesarean delivery: Secondary | ICD-10-CM | POA: Diagnosis not present

## 2017-08-06 DIAGNOSIS — D259 Leiomyoma of uterus, unspecified: Secondary | ICD-10-CM | POA: Diagnosis not present

## 2017-08-06 DIAGNOSIS — O99344 Other mental disorders complicating childbirth: Secondary | ICD-10-CM | POA: Diagnosis present

## 2017-08-06 DIAGNOSIS — O9962 Diseases of the digestive system complicating childbirth: Secondary | ICD-10-CM | POA: Diagnosis not present

## 2017-08-06 DIAGNOSIS — O3413 Maternal care for benign tumor of corpus uteri, third trimester: Secondary | ICD-10-CM | POA: Diagnosis not present

## 2017-08-06 DIAGNOSIS — O3429 Maternal care due to uterine scar from other previous surgery: Secondary | ICD-10-CM | POA: Diagnosis not present

## 2017-08-06 DIAGNOSIS — Z3A38 38 weeks gestation of pregnancy: Secondary | ICD-10-CM | POA: Diagnosis not present

## 2017-08-06 DIAGNOSIS — F419 Anxiety disorder, unspecified: Secondary | ICD-10-CM | POA: Diagnosis not present

## 2017-08-06 DIAGNOSIS — O288 Other abnormal findings on antenatal screening of mother: Secondary | ICD-10-CM | POA: Diagnosis not present

## 2017-08-06 DIAGNOSIS — Z3A36 36 weeks gestation of pregnancy: Secondary | ICD-10-CM | POA: Diagnosis not present

## 2017-08-06 DIAGNOSIS — O99824 Streptococcus B carrier state complicating childbirth: Secondary | ICD-10-CM | POA: Diagnosis present

## 2017-08-06 DIAGNOSIS — O368130 Decreased fetal movements, third trimester, not applicable or unspecified: Secondary | ICD-10-CM | POA: Diagnosis not present

## 2017-08-06 LAB — CBC
HEMATOCRIT: 36.5 % (ref 36.0–46.0)
HEMOGLOBIN: 11.9 g/dL — AB (ref 12.0–15.0)
MCH: 28.3 pg (ref 26.0–34.0)
MCHC: 32.6 g/dL (ref 30.0–36.0)
MCV: 86.7 fL (ref 78.0–100.0)
Platelets: 233 10*3/uL (ref 150–400)
RBC: 4.21 MIL/uL (ref 3.87–5.11)
RDW: 15.7 % — AB (ref 11.5–15.5)
WBC: 13.7 10*3/uL — ABNORMAL HIGH (ref 4.0–10.5)

## 2017-08-06 LAB — TYPE AND SCREEN
ABO/RH(D): A POS
Antibody Screen: NEGATIVE

## 2017-08-06 LAB — URINALYSIS, ROUTINE W REFLEX MICROSCOPIC
Bilirubin Urine: NEGATIVE
HGB URINE DIPSTICK: NEGATIVE
Ketones, ur: NEGATIVE mg/dL
Leukocytes, UA: NEGATIVE
Nitrite: NEGATIVE
PH: 6 (ref 5.0–8.0)
Protein, ur: NEGATIVE mg/dL
SPECIFIC GRAVITY, URINE: 1 — AB (ref 1.005–1.030)

## 2017-08-06 SURGERY — Surgical Case
Anesthesia: Spinal | Site: Abdomen | Wound class: Clean Contaminated

## 2017-08-06 MED ORDER — MORPHINE SULFATE (PF) 0.5 MG/ML IJ SOLN
INTRAMUSCULAR | Status: DC | PRN
Start: 2017-08-06 — End: 2017-08-06
  Administered 2017-08-06: .2 mg via INTRATHECAL

## 2017-08-06 MED ORDER — EPHEDRINE SULFATE 50 MG/ML IJ SOLN
INTRAMUSCULAR | Status: AC
Start: 2017-08-06 — End: ?
  Filled 2017-08-06: qty 1

## 2017-08-06 MED ORDER — SOD CITRATE-CITRIC ACID 500-334 MG/5ML PO SOLN
30.0000 mL | Freq: Once | ORAL | Status: AC
  Administered 2017-08-06: 30 mL via ORAL
  Filled 2017-08-06: qty 15

## 2017-08-06 MED ORDER — PHENYLEPHRINE 8 MG IN D5W 100 ML (0.08MG/ML) PREMIX OPTIME
INJECTION | INTRAVENOUS | Status: DC | PRN
  Administered 2017-08-06: 60 ug/min via INTRAVENOUS

## 2017-08-06 MED ORDER — ONDANSETRON HCL 4 MG/2ML IJ SOLN
INTRAMUSCULAR | Status: AC
  Filled 2017-08-06: qty 2

## 2017-08-06 MED ORDER — NALBUPHINE HCL 10 MG/ML IJ SOLN: 5.0000 mg | INTRAMUSCULAR | Status: DC | PRN

## 2017-08-06 MED ORDER — OXYCODONE HCL 5 MG/5ML PO SOLN: 5.0000 mg | Freq: Once | ORAL | Status: DC | PRN

## 2017-08-06 MED ORDER — SODIUM CHLORIDE 0.9% FLUSH: 3.0000 mL | INTRAVENOUS | Status: DC | PRN

## 2017-08-06 MED ORDER — OXYTOCIN 10 UNIT/ML IJ SOLN
INTRAMUSCULAR | Status: AC
Start: 2017-08-06 — End: ?
  Filled 2017-08-06: qty 4

## 2017-08-06 MED ORDER — PHENYLEPHRINE 8 MG IN D5W 100 ML (0.08MG/ML) PREMIX OPTIME
INJECTION | INTRAVENOUS | Status: AC
  Filled 2017-08-06: qty 100

## 2017-08-06 MED ORDER — NALOXONE HCL 0.4 MG/ML IJ SOLN: 0.4000 mg | INTRAMUSCULAR | Status: DC | PRN

## 2017-08-06 MED ORDER — DIPHENHYDRAMINE HCL 50 MG/ML IJ SOLN
INTRAMUSCULAR | Status: AC
  Filled 2017-08-06: qty 1

## 2017-08-06 MED ORDER — MORPHINE SULFATE (PF) 0.5 MG/ML IJ SOLN
INTRAMUSCULAR | Status: AC
Start: 2017-08-06 — End: ?
  Filled 2017-08-06: qty 10

## 2017-08-06 MED ORDER — MIDAZOLAM HCL 5 MG/5ML IJ SOLN
INTRAMUSCULAR | Status: DC | PRN
  Administered 2017-08-06: 1 mg via INTRAVENOUS

## 2017-08-06 MED ORDER — KETOROLAC TROMETHAMINE 30 MG/ML IJ SOLN
30.0000 mg | Freq: Four times a day (QID) | INTRAMUSCULAR | Status: DC | PRN
  Administered 2017-08-07: 30 mg via INTRAMUSCULAR

## 2017-08-06 MED ORDER — ONDANSETRON HCL 4 MG/2ML IJ SOLN: 4.0000 mg | Freq: Three times a day (TID) | INTRAMUSCULAR | Status: DC | PRN

## 2017-08-06 MED ORDER — MIDAZOLAM HCL 2 MG/2ML IJ SOLN
INTRAMUSCULAR | Status: AC
  Filled 2017-08-06: qty 2

## 2017-08-06 MED ORDER — NALBUPHINE HCL 10 MG/ML IJ SOLN: 5.0000 mg | Freq: Once | INTRAMUSCULAR | Status: DC | PRN

## 2017-08-06 MED ORDER — KETOROLAC TROMETHAMINE 30 MG/ML IJ SOLN: 30.0000 mg | Freq: Four times a day (QID) | INTRAMUSCULAR | Status: DC | PRN

## 2017-08-06 MED ORDER — BUPIVACAINE HCL (PF) 0.25 % IJ SOLN
INTRAMUSCULAR | Status: DC | PRN
  Administered 2017-08-06: 30 mL

## 2017-08-06 MED ORDER — FENTANYL CITRATE (PF) 100 MCG/2ML IJ SOLN
INTRAMUSCULAR | Status: AC
  Filled 2017-08-06: qty 2

## 2017-08-06 MED ORDER — ONDANSETRON HCL 4 MG/2ML IJ SOLN
INTRAMUSCULAR | Status: DC | PRN
  Administered 2017-08-06: 4 mg via INTRAVENOUS

## 2017-08-06 MED ORDER — METOCLOPRAMIDE HCL 5 MG/ML IJ SOLN
INTRAMUSCULAR | Status: AC
  Filled 2017-08-06: qty 2

## 2017-08-06 MED ORDER — DEXTROSE 5 % IV SOLN
1.0000 ug/kg/h | INTRAVENOUS | Status: DC | PRN
  Filled 2017-08-06: qty 5

## 2017-08-06 MED ORDER — FAMOTIDINE IN NACL 20-0.9 MG/50ML-% IV SOLN
20.0000 mg | Freq: Once | INTRAVENOUS | Status: AC
  Administered 2017-08-06: 20 mg via INTRAVENOUS
  Filled 2017-08-06: qty 50

## 2017-08-06 MED ORDER — LACTATED RINGERS IV BOLUS (SEPSIS): 1000.0000 mL | Freq: Once | INTRAVENOUS | Status: DC

## 2017-08-06 MED ORDER — LACTATED RINGERS IV SOLN
INTRAVENOUS | Status: DC | PRN
  Administered 2017-08-06 (×2): via INTRAVENOUS

## 2017-08-06 MED ORDER — DIPHENHYDRAMINE HCL 25 MG PO CAPS
25.0000 mg | ORAL_CAPSULE | ORAL | Status: DC | PRN
  Filled 2017-08-06: qty 1

## 2017-08-06 MED ORDER — BUPIVACAINE IN DEXTROSE 0.75-8.25 % IT SOLN
INTRATHECAL | Status: AC
  Filled 2017-08-06: qty 2

## 2017-08-06 MED ORDER — CEFAZOLIN SODIUM-DEXTROSE 2-4 GM/100ML-% IV SOLN
2.0000 g | INTRAVENOUS | Status: AC
  Administered 2017-08-06: 2 g via INTRAVENOUS
  Filled 2017-08-06: qty 100

## 2017-08-06 MED ORDER — METOCLOPRAMIDE HCL 5 MG/ML IJ SOLN
INTRAMUSCULAR | Status: DC | PRN
  Administered 2017-08-06: 10 mg via INTRAVENOUS

## 2017-08-06 MED ORDER — MEPERIDINE HCL 25 MG/ML IJ SOLN: 6.2500 mg | INTRAMUSCULAR | Status: DC | PRN

## 2017-08-06 MED ORDER — SCOPOLAMINE 1 MG/3DAYS TD PT72
MEDICATED_PATCH | TRANSDERMAL | Status: AC
  Filled 2017-08-06: qty 1

## 2017-08-06 MED ORDER — BUPIVACAINE IN DEXTROSE 0.75-8.25 % IT SOLN
INTRATHECAL | Status: DC | PRN
  Administered 2017-08-06: 1.8 mL via INTRATHECAL

## 2017-08-06 MED ORDER — FENTANYL CITRATE (PF) 100 MCG/2ML IJ SOLN
INTRAMUSCULAR | Status: DC | PRN
  Administered 2017-08-06: 65 ug via INTRAVENOUS
  Administered 2017-08-06: 10 ug via INTRATHECAL
  Administered 2017-08-06: 25 ug via INTRAVENOUS

## 2017-08-06 MED ORDER — ONDANSETRON HCL 4 MG/2ML IJ SOLN: 4.0000 mg | Freq: Four times a day (QID) | INTRAMUSCULAR | Status: DC | PRN

## 2017-08-06 MED ORDER — LACTATED RINGERS IV BOLUS (SEPSIS)
1000.0000 mL | Freq: Once | INTRAVENOUS | Status: AC
  Administered 2017-08-06: 1000 mL via INTRAVENOUS

## 2017-08-06 MED ORDER — DIPHENHYDRAMINE HCL 50 MG/ML IJ SOLN: 12.5000 mg | INTRAMUSCULAR | Status: DC | PRN

## 2017-08-06 MED ORDER — SCOPOLAMINE 1 MG/3DAYS TD PT72
MEDICATED_PATCH | TRANSDERMAL | Status: DC | PRN
  Administered 2017-08-06: 1 via TRANSDERMAL

## 2017-08-06 MED ORDER — DEXAMETHASONE SODIUM PHOSPHATE 10 MG/ML IJ SOLN
INTRAMUSCULAR | Status: DC | PRN
  Administered 2017-08-06: 10 mg via INTRAVENOUS

## 2017-08-06 MED ORDER — DEXAMETHASONE SODIUM PHOSPHATE 10 MG/ML IJ SOLN
INTRAMUSCULAR | Status: AC
  Filled 2017-08-06: qty 1

## 2017-08-06 MED ORDER — OXYCODONE HCL 5 MG PO TABS: 5.0000 mg | ORAL_TABLET | Freq: Once | ORAL | Status: DC | PRN

## 2017-08-06 MED ORDER — DIPHENHYDRAMINE HCL 50 MG/ML IJ SOLN
INTRAMUSCULAR | Status: DC | PRN
  Administered 2017-08-06: 25 mg via INTRAVENOUS

## 2017-08-06 MED ORDER — FENTANYL CITRATE (PF) 100 MCG/2ML IJ SOLN: 25.0000 ug | INTRAMUSCULAR | Status: DC | PRN

## 2017-08-06 MED ORDER — SCOPOLAMINE 1 MG/3DAYS TD PT72: 1.0000 | MEDICATED_PATCH | Freq: Once | TRANSDERMAL | Status: DC

## 2017-08-06 MED ORDER — PHENYLEPHRINE 40 MCG/ML (10ML) SYRINGE FOR IV PUSH (FOR BLOOD PRESSURE SUPPORT)
PREFILLED_SYRINGE | INTRAVENOUS | Status: AC
  Filled 2017-08-06: qty 20

## 2017-08-06 SURGICAL SUPPLY — 42 items
BARRIER ADHS 3X4 INTERCEED (GAUZE/BANDAGES/DRESSINGS) ×2 IMPLANT
BENZOIN TINCTURE PRP APPL 2/3 (GAUZE/BANDAGES/DRESSINGS) ×2 IMPLANT
CHLORAPREP W/TINT 26ML (MISCELLANEOUS) ×2 IMPLANT
CLAMP CORD UMBIL (MISCELLANEOUS) IMPLANT
CLOTH BEACON ORANGE TIMEOUT ST (SAFETY) ×2 IMPLANT
CONTAINER PREFILL 10% NBF 15ML (MISCELLANEOUS) IMPLANT
DRAPE C SECTION CLR SCREEN (DRAPES) ×2 IMPLANT
DRSG OPSITE POSTOP 4X10 (GAUZE/BANDAGES/DRESSINGS) ×2 IMPLANT
ELECT REM PT RETURN 9FT ADLT (ELECTROSURGICAL) ×2
ELECTRODE REM PT RTRN 9FT ADLT (ELECTROSURGICAL) ×1 IMPLANT
EXTRACTOR VACUUM M CUP 4 TUBE (SUCTIONS) IMPLANT
GLOVE BIOGEL PI IND STRL 7.0 (GLOVE) ×5 IMPLANT
GLOVE BIOGEL PI INDICATOR 7.0 (GLOVE) ×5
GLOVE ECLIPSE 6.5 STRL STRAW (GLOVE) ×2 IMPLANT
GLOVE SURG SS PI 7.0 STRL IVOR (GLOVE) ×2 IMPLANT
GOWN STRL REUS W/TWL LRG LVL3 (GOWN DISPOSABLE) ×4 IMPLANT
KIT ABG SYR 3ML LUER SLIP (SYRINGE) IMPLANT
NEEDLE HYPO 22GX1.5 SAFETY (NEEDLE) ×2 IMPLANT
NEEDLE HYPO 25X5/8 SAFETYGLIDE (NEEDLE) IMPLANT
NS IRRIG 1000ML POUR BTL (IV SOLUTION) ×2 IMPLANT
PACK C SECTION WH (CUSTOM PROCEDURE TRAY) ×2 IMPLANT
PAD OB MATERNITY 4.3X12.25 (PERSONAL CARE ITEMS) ×2 IMPLANT
RTRCTR C-SECT PINK 25CM LRG (MISCELLANEOUS) IMPLANT
STRIP CLOSURE SKIN 1/2X4 (GAUZE/BANDAGES/DRESSINGS) ×2 IMPLANT
SUT CHROMIC GUT AB #0 18 (SUTURE) IMPLANT
SUT MNCRL 0 VIOLET CTX 36 (SUTURE) ×3 IMPLANT
SUT MON AB 2-0 SH 27 (SUTURE)
SUT MON AB 2-0 SH27 (SUTURE) IMPLANT
SUT MON AB 3-0 SH 27 (SUTURE)
SUT MON AB 3-0 SH27 (SUTURE) IMPLANT
SUT MON AB 4-0 PS1 27 (SUTURE) IMPLANT
SUT MONOCRYL 0 CTX 36 (SUTURE) ×3
SUT PLAIN 2 0 (SUTURE)
SUT PLAIN 2 0 XLH (SUTURE) ×2 IMPLANT
SUT PLAIN ABS 2-0 CT1 27XMFL (SUTURE) IMPLANT
SUT VIC AB 0 CT1 36 (SUTURE) ×4 IMPLANT
SUT VIC AB 2-0 CT1 27 (SUTURE) ×1
SUT VIC AB 2-0 CT1 TAPERPNT 27 (SUTURE) ×1 IMPLANT
SUT VIC AB 4-0 PS2 27 (SUTURE) IMPLANT
SYR CONTROL 10ML LL (SYRINGE) ×2 IMPLANT
TOWEL OR 17X24 6PK STRL BLUE (TOWEL DISPOSABLE) ×2 IMPLANT
TRAY FOLEY BAG SILVER LF 14FR (SET/KITS/TRAYS/PACK) IMPLANT

## 2017-08-06 NOTE — Anesthesia Procedure Notes (Signed)
Spinal  Patient location during procedure: OR Start time: 08/06/2017 10:35 PM End time: 08/06/2017 10:38 PM Staffing Anesthesiologist: Albertha Ghee, MD Performed: anesthesiologist  Preanesthetic Checklist Completed: patient identified, surgical consent, pre-op evaluation, timeout performed, IV checked, risks and benefits discussed and monitors and equipment checked Spinal Block Patient position: sitting Prep: DuraPrep Patient monitoring: cardiac monitor, continuous pulse ox and blood pressure Approach: midline Location: L3-4 Injection technique: single-shot Needle Needle type: Pencan  Needle gauge: 24 G Needle length: 9 cm Assessment Sensory level: T10 Additional Notes Functioning IV was confirmed and monitors were applied. Sterile prep and drape, including hand hygiene and sterile gloves were used. The patient was positioned and the spine was prepped. The skin was anesthetized with lidocaine.  Free flow of clear CSF was obtained prior to injecting local anesthetic into the CSF.  The spinal needle aspirated freely following injection.  The needle was carefully withdrawn.  The patient tolerated the procedure well.

## 2017-08-06 NOTE — Anesthesia Preprocedure Evaluation (Signed)
Anesthesia Evaluation  Patient identified by MRN, date of birth, ID band Patient awake    Reviewed: Allergy & Precautions, H&P , NPO status , Patient's Chart, lab work & pertinent test results  Airway Mallampati: II   Neck ROM: full    Dental   Pulmonary neg pulmonary ROS,    breath sounds clear to auscultation       Cardiovascular negative cardio ROS   Rhythm:regular Rate:Normal     Neuro/Psych PSYCHIATRIC DISORDERS Anxiety    GI/Hepatic GERD  ,  Endo/Other    Renal/GU      Musculoskeletal   Abdominal   Peds  Hematology   Anesthesia Other Findings   Reproductive/Obstetrics                             Anesthesia Physical Anesthesia Plan  ASA: II  Anesthesia Plan: Spinal   Post-op Pain Management:  Regional for Post-op pain   Induction:   PONV Risk Score and Plan: 2 and Treatment may vary due to age or medical condition  Airway Management Planned: Simple Face Mask  Additional Equipment:   Intra-op Plan:   Post-operative Plan:   Informed Consent: I have reviewed the patients History and Physical, chart, labs and discussed the procedure including the risks, benefits and alternatives for the proposed anesthesia with the patient or authorized representative who has indicated his/her understanding and acceptance.     Plan Discussed with: CRNA, Anesthesiologist and Surgeon  Anesthesia Plan Comments:         Anesthesia Quick Evaluation

## 2017-08-06 NOTE — MAU Provider Note (Signed)
History     Chief Complaint  Patient presents with  . Decreased Fetal Movement   36 yo G1P0 MBF @ [redacted] wks gestation presents with c/o DFM since yesterday. Prev myomectomy sched for C/S 12/12. Denies LOF or vaginal bleeding  OB History    Gravida Para Term Preterm AB Living   1 1   1   1    SAB TAB Ectopic Multiple Live Births         0 1      Past Medical History:  Diagnosis Date  . Anxiety   . Asherman's syndrome   . Eczema    Neck, behind ears and scalp  . Fibroid   . Hyperlipidemia   . Wears contact lenses     Past Surgical History:  Procedure Laterality Date  . DX LAPAROSCOPY/  LAPAROTOMY MYOMECTOMY  2013  . HYSTEROSCOPY N/A 07/04/2014   Procedure: HYSTEROSCOPY WITH LYSIS OF INTER UTERINE ADHESIONS;  Surgeon: Governor Specking, MD;  Location: [facility];  Service: Gynecology;  Laterality: N/A;  . LAPAROSCOPY N/A 07/04/2014   Procedure: LAPAROSCOPY WITH EXTENSIVE LYSIS OF ADHESIONS, BILATERAL SALPINGO-OOPHOROLYSIS.;  Surgeon: Governor Specking, MD;  Location: [facility];  Service: Gynecology;  Laterality: N/A;    Family History  Adopted: Yes    Social History   Tobacco Use  . Smoking status: Never Smoker  . Smokeless tobacco: Never Used  Substance Use Topics  . Alcohol use: No  . Drug use: No    Allergies: No Known Allergies  Medications Prior to Admission  Medication Sig Dispense Refill Last Dose  . hydrOXYzine (VISTARIL) 25 MG capsule Take 1 capsule (25 mg total) by mouth 3 (three) times daily as needed. 30 capsule 0   . LORazepam (ATIVAN) 0.5 MG tablet Take 1 tablet (0.5 mg total) by mouth daily as needed for anxiety. 30 tablet 0 Past Week at Unknown time  . Magnesium 400 MG CAPS Take by mouth.   Past Month at Unknown time  . Multiple Vitamins-Minerals (WOMENS MULTIVITAMIN PLUS) TABS Take by mouth.   06/06/2017 at Unknown time  . Probiotic Product (PROBIOTIC DAILY PO) Take by mouth.   Past Month at Unknown time  . Zinc 30 MG  CAPS Take by mouth.   Past Month at Unknown time     Physical Exam   Blood pressure 127/71, pulse 91, temperature 98 F (36.7 C), temperature source Oral, resp. rate 18, height 5\' 5"  (1.651 m), weight 99.3 kg (219 lb), last menstrual period 09/14/2016, SpO2 98 %.  General appearance: alert, cooperative and mild distress Lungs: clear to auscultation bilaterally Heart: regular rate and rhythm, S1, S2 normal, no murmur, click, rub or gallop Abdomen: gravid nontender  Tracing: baseline 150 no variability no accels No ctx ED Course  IMP: NR NST Decreased fetal monitoring IUP @ 36 week Prev myomectomy  P) BPP. IVF. Labs on standby MDM   Marvene Staff, MD 9:06 PM 08/06/2017

## 2017-08-06 NOTE — Brief Op Note (Signed)
08/06/2017  11:56 PM  PATIENT:  Nicole Wiggins  36 y.o. female  PRE-OPERATIVE DIAGNOSIS: Non reassuring fetal testing, BPP 2/10,  Previous Myomectomy, Fibroid uterus complicating pregnancy, IUP @ 36 weeks  POST-OPERATIVE DIAGNOSIS:  same  PROCEDURE:  Primary cesarean section, kerr hysterotomy  SURGEON:  Surgeon(s) and Role:    * Shaheen Mende, Alanda Slim, MD - Primary  PHYSICIAN ASSISTANT:   ASSISTANTS: Derrell Lolling, CNM   ANESTHESIA:   spinal FINDINGS; ROP live female with multiple cords around feet,  Cord ph 7.00, multiple uterine fibroids largest 6-7cm posterior SS, IM fibroids, left tube nl right tube ok, right ov plastered on right uterine side Apgar 3/7 EBL:  1316 mL  I/O: 1300/450 BLOOD ADMINISTERED:none  DRAINS: none   LOCAL MEDICATIONS USED:  MARCAINE     SPECIMEN:  Source of Specimen:  placenta  DISPOSITION OF SPECIMEN:  PATHOLOGY  COUNTS:  YES  TOURNIQUET:  * No tourniquets in log *  DICTATION: .Other Dictation: Dictation Number [id number]  PLAN OF CARE: Admit to inpatient   PATIENT DISPOSITION:  PACU - hemodynamically stable.   Delay start of Pharmacological VTE agent (>24hrs) due to surgical blood loss or risk of bleeding: no

## 2017-08-06 NOTE — Transfer of Care (Signed)
Immediate Anesthesia Transfer of Care Note  Patient: Nicole Wiggins  Procedure(s) Performed: Primary CESAREAN SECTION (N/A Abdomen)  Patient Location: PACU  Anesthesia Type:Spinal  Level of Consciousness: awake, alert  and oriented  Airway & Oxygen Therapy: Patient Spontanous Breathing  Post-op Assessment: Report given to RN and Post -op Vital signs reviewed and stable  Post vital signs: Reviewed and stable  Last Vitals:  Vitals:   08/06/17 2013  BP: 127/71  Pulse: 91  Resp: 18  Temp: 36.7 C  SpO2: 98%    Last Pain:  Vitals:   08/06/17 2013  TempSrc: Oral         Complications: No apparent anesthesia complications

## 2017-08-06 NOTE — Consult Note (Signed)
Neonatology Note:   Attendance at C-section:    I was asked by Dr. Garwin Brothers to attend this primary C/S at 36 weeks due to NRFHT. The mother is a G1, GBS + with good prenatal care that was otherwise uncomplicated apart from h/o myomectomy.  Today reports decreased fetal  movement for the past 12 hours. ROM 0 hours before delivery, fluid clear. Infant with clinched posture occasional breathes. Terminal meconium.  Cord clamped at ~15 sec.  Infant brought to warmer and dried and stimulated.  HR <100 and >60.  CPAP initiated.  HR trend downward. PPV started, pulse ox placed and bulb suctioned with removal of mucous. PPV continued.  Infant with gradually improving spontaneous respiratory effort, tone and HR. Fio2 titrated to 100% in order to maintain appropriate SAo2.  With removal of mask, immediate loss of FRC with desaturations.  Ap 3/5/7. Lungs equally bilateral and coarse, nl S1 and S2.  Transferred on CPAP 6 100% without issues to NICU accompanied by father.    Monia Sabal Katherina Mires, MD

## 2017-08-06 NOTE — H&P (Addendum)
Nicole Wiggins is a 36 y.o. female presenting @ 41 weeks( [place] 09/03/2017) with c/o decreased FM. Eval showed BPP 2/10. Previous myomectomy scheduled for prim C/S 12/12.  IVF/ICSI pregnancy.  BMZ complete  OB History    Gravida Para Term Preterm AB Living   1             SAB TAB Ectopic Multiple Live Births                 Past Medical History:  Diagnosis Date  . Anxiety   . Asherman's syndrome   . Eczema    Neck, behind ears and scalp  . Fibroid   . Hyperlipidemia   . Wears contact lenses    Past Surgical History:  Procedure Laterality Date  . DX LAPAROSCOPY/  LAPAROTOMY MYOMECTOMY  2013  . HYSTEROSCOPY N/A 07/04/2014   Procedure: HYSTEROSCOPY WITH LYSIS OF INTER UTERINE ADHESIONS;  Surgeon: Nicole Specking, MD;  Location: [facility];  Service: Gynecology;  Laterality: N/A;  . LAPAROSCOPY N/A 07/04/2014   Procedure: LAPAROSCOPY WITH EXTENSIVE LYSIS OF ADHESIONS, BILATERAL SALPINGO-OOPHOROLYSIS.;  Surgeon: Nicole Specking, MD;  Location: [facility];  Service: Gynecology;  Laterality: N/A;   Family History: family history is not on file. She was adopted. Social History:  reports that  has never smoked. she has never used smokeless tobacco. She reports that she does not drink alcohol or use drugs.     Maternal Diabetes: No Genetic Screening: Normal( pre- genetic testing done on IVF) Maternal Ultrasounds/Referrals: Normal Fetal Ultrasounds or other Referrals:  None Maternal Substance Abuse:  No Significant Maternal Medications:  Meds include: Other: ativan, ambien,  Significant Maternal Lab Results:  Lab values include: Group B Strep positive Other Comments:  IVF/ICSi preg. BMZ complete  Review of Systems  All other systems reviewed and are negative.  History   Blood pressure 127/71, pulse 91, temperature 98 F (36.7 C), temperature source Oral, resp. rate 18, height 5\' 5"  (1.651 m), weight 99.3 kg (219 lb), last menstrual period 09/14/2016,  SpO2 98 %. Exam Physical Exam  Prenatal labs: ABO, Rh: A/Positive/-- (05/25 0000) Antibody: Negative (05/25 0000) Rubella: Immune (05/25 0000) RPR: Nonreactive (05/25 0000)  HBsAg: Negative (05/25 0000)  HIV: Non-reactive (05/25 0000)  GBS:   positive  Assessment/Plan: BPP 2/10 Previous myomectomy IUP @ 36 weeks Fibroid uterus in preg GBS cx (+) P) Primary C/S. Risk of surgery reviewed including infection, bleeding, poss need for blood transfusion and its risk, internal scar tissue, injury to surrounding organ structures. ALL ? answered  Nicole Wiggins A Nicole Wiggins 08/06/2017, 10:08 PM

## 2017-08-06 NOTE — MAU Note (Signed)
Not felt any fetal movement today, last time was yesterday afternoon. No bleeding. No leaking.

## 2017-08-07 ENCOUNTER — Encounter (HOSPITAL_COMMUNITY): Payer: Self-pay | Admitting: Neonatology

## 2017-08-07 ENCOUNTER — Other Ambulatory Visit: Payer: Self-pay

## 2017-08-07 LAB — CBC
HEMATOCRIT: 38.3 % (ref 36.0–46.0)
Hemoglobin: 12.4 g/dL (ref 12.0–15.0)
MCH: 28.2 pg (ref 26.0–34.0)
MCHC: 32.4 g/dL (ref 30.0–36.0)
MCV: 87.2 fL (ref 78.0–100.0)
Platelets: 236 10*3/uL (ref 150–400)
RBC: 4.39 MIL/uL (ref 3.87–5.11)
RDW: 15.6 % — AB (ref 11.5–15.5)
WBC: 20 10*3/uL — AB (ref 4.0–10.5)

## 2017-08-07 LAB — RPR: RPR Ser Ql: NONREACTIVE

## 2017-08-07 LAB — ABO/RH: ABO/RH(D): A POS

## 2017-08-07 MED ORDER — SENNOSIDES-DOCUSATE SODIUM 8.6-50 MG PO TABS
2.0000 | ORAL_TABLET | ORAL | Status: DC
  Administered 2017-08-07 – 2017-08-08 (×2): 2 via ORAL
  Filled 2017-08-07 (×2): qty 2

## 2017-08-07 MED ORDER — MENTHOL 3 MG MT LOZG: 1.0000 | LOZENGE | OROMUCOSAL | Status: DC | PRN

## 2017-08-07 MED ORDER — PRENATAL MULTIVITAMIN CH
1.0000 | ORAL_TABLET | Freq: Every day | ORAL | Status: DC
  Administered 2017-08-07 – 2017-08-09 (×3): 1 via ORAL
  Filled 2017-08-07 (×3): qty 1

## 2017-08-07 MED ORDER — SIMETHICONE 80 MG PO CHEW
80.0000 mg | CHEWABLE_TABLET | Freq: Three times a day (TID) | ORAL | Status: DC
  Administered 2017-08-07 – 2017-08-09 (×7): 80 mg via ORAL
  Filled 2017-08-07 (×7): qty 1

## 2017-08-07 MED ORDER — ZOLPIDEM TARTRATE 5 MG PO TABS
5.0000 mg | ORAL_TABLET | Freq: Every evening | ORAL | Status: DC | PRN
  Administered 2017-08-08: 5 mg via ORAL
  Filled 2017-08-07: qty 1

## 2017-08-07 MED ORDER — LACTATED RINGERS IV SOLN: INTRAVENOUS | Status: DC

## 2017-08-07 MED ORDER — DIPHENHYDRAMINE HCL 25 MG PO CAPS: 25.0000 mg | ORAL_CAPSULE | Freq: Four times a day (QID) | ORAL | Status: DC | PRN

## 2017-08-07 MED ORDER — OXYCODONE-ACETAMINOPHEN 5-325 MG PO TABS
1.0000 | ORAL_TABLET | ORAL | Status: DC | PRN
  Administered 2017-08-08 – 2017-08-09 (×4): 1 via ORAL
  Filled 2017-08-07 (×4): qty 1

## 2017-08-07 MED ORDER — OXYCODONE-ACETAMINOPHEN 5-325 MG PO TABS
2.0000 | ORAL_TABLET | ORAL | Status: DC | PRN
Start: 2017-08-07 — End: 2017-08-09

## 2017-08-07 MED ORDER — OXYTOCIN 40 UNITS IN LACTATED RINGERS INFUSION - SIMPLE MED: 2.5000 [IU]/h | INTRAVENOUS | Status: AC

## 2017-08-07 MED ORDER — WITCH HAZEL-GLYCERIN EX PADS: 1.0000 "application " | MEDICATED_PAD | CUTANEOUS | Status: DC | PRN

## 2017-08-07 MED ORDER — DIBUCAINE 1 % RE OINT: 1.0000 "application " | TOPICAL_OINTMENT | RECTAL | Status: DC | PRN

## 2017-08-07 MED ORDER — SIMETHICONE 80 MG PO CHEW
80.0000 mg | CHEWABLE_TABLET | ORAL | Status: DC
  Administered 2017-08-07 – 2017-08-08 (×2): 80 mg via ORAL
  Filled 2017-08-07 (×2): qty 1

## 2017-08-07 MED ORDER — SERTRALINE HCL 50 MG PO TABS
50.0000 mg | ORAL_TABLET | Freq: Every day | ORAL | Status: DC
  Administered 2017-08-08 – 2017-08-09 (×2): 50 mg via ORAL
  Filled 2017-08-07 (×3): qty 1

## 2017-08-07 MED ORDER — SIMETHICONE 80 MG PO CHEW: 80.0000 mg | CHEWABLE_TABLET | ORAL | Status: DC | PRN

## 2017-08-07 MED ORDER — IBUPROFEN 600 MG PO TABS
600.0000 mg | ORAL_TABLET | Freq: Four times a day (QID) | ORAL | Status: DC
  Administered 2017-08-07 – 2017-08-09 (×10): 600 mg via ORAL
  Filled 2017-08-07 (×10): qty 1

## 2017-08-07 MED ORDER — KETOROLAC TROMETHAMINE 30 MG/ML IJ SOLN
INTRAMUSCULAR | Status: AC
  Filled 2017-08-07: qty 1

## 2017-08-07 MED ORDER — COCONUT OIL OIL: 1.0000 "application " | TOPICAL_OIL | Status: DC | PRN

## 2017-08-07 NOTE — Progress Notes (Signed)
Subjective: POD# 1 Information for the patient's newborn:  Avonell, Lenig [[id number]]  female NICU admit for respiratory distress, now extubated and improving, will start feeds today  Reports feeling well, has been visiting NICU this AM Feeding: breast, pimping Patient reports tolerating PO.  Breast symptoms:minimal colostrum Pain controlled with PO meds Denies HA/SOB/C/P/N/V/dizziness. Flatus +. She reports vaginal bleeding as normal, without clots.  She is ambulating, urinating without difficulty.     Objective:   VS:    Vitals:   08/07/17 0500 08/07/17 0600 08/07/17 0749 08/07/17 1133  BP:  131/60 123/66 123/62  Pulse:  60 69 66  Resp: 18 18 18 18   Temp:  98.6 F (37 C) 97.9 F (36.6 C) 98.5 F (36.9 C)  TempSrc:  Oral Oral Oral  SpO2: 97% 98% 98% 99%  Weight:      Height:          Intake/Output Summary (Last 24 hours) at 08/07/2017 1211 Last data filed at 08/07/2017 0959 Gross per 24 hour  Intake 2370 ml  Output 2416 ml  Net -46 ml        Recent Labs    08/06/17 2110 08/07/17 0525  WBC 13.7* 20.0*  HGB 11.9* 12.4  HCT 36.5 38.3  PLT 233 236     Blood type: --/--/A POS, A POS (12/01 2110)  Rubella: Immune (05/25 0000)     Physical Exam:  General: alert, cooperative and no distress CV: Regular rate and rhythm Resp: clear Abdomen: soft, nontender, normal bowel sounds Incision: clean, dry and intact Uterine Fundus: firm, below umbilicus, nontender Lochia: minimal Ext: no edema, redness or tenderness in the calves or thighs      Assessment/Plan: 36 y.o.   POD# 1. [id number]                  Principal Problem:   Cesarean delivery / NRFHT, BPP 2/10,  Previous Myomectomy, Fibroid uterus complicating pregnancy, IUP @ 36 weeks - 12/1 Active Problems:   Postpartum care following cesarean delivery   Doing well, stable.               Advance diet as tolerated Encourage rest when baby rests Breastfeeding support Encourage to ambulate Routine post-op  care  Juliene Pina, CNM, MSN 08/07/2017, 12:11 PM

## 2017-08-07 NOTE — Anesthesia Postprocedure Evaluation (Signed)
Anesthesia Post Note  Patient: Saleha Murguia  Procedure(s) Performed: Primary CESAREAN SECTION (N/A Abdomen)     Patient location during evaluation: [facility] Unit Anesthesia Type: Spinal Level of consciousness: awake and alert and oriented Pain management: pain level controlled Vital Signs Assessment: post-procedure vital signs reviewed and stable Respiratory status: spontaneous breathing and nonlabored ventilation Cardiovascular status: stable Postop Assessment: no headache, adequate PO intake, no backache, spinal receding, patient able to bend at knees and no apparent nausea or vomiting Anesthetic complications: no    Last Vitals:  Vitals:   08/07/17 0600 08/07/17 0749  BP: 131/60 123/66  Pulse: 60 69  Resp: 18 18  Temp: 37 C 36.6 C  SpO2: 98% 98%    Last Pain:  Vitals:   08/07/17 0800  TempSrc:   PainSc: 0-No pain   Pain Goal: Patients Stated Pain Goal: 2 (08/07/17 0500)               Jabier Mutton

## 2017-08-07 NOTE — Plan of Care (Signed)
Patient tolerating ambulation to NICU without distress. Up ad lib without c/o pain. Using Motrin q6 hours as ordered with good pain control per patient report. Denies need of further medication at this time.  Coping well with situational stress. States, "I'm a lot better now that I know my baby is doing better."

## 2017-08-07 NOTE — Lactation Note (Signed)
This note was copied from a baby's chart. Lactation Consultation Note  Patient Name: Nicole Wiggins [id number] Date: 08/07/2017 Reason for consult: Initial assessment;Primapara;NICU baby  Initial visit at 55 hours of life. Mom is a primip whose pregnancy was a result of IVF. She denied breasts changes with pregnancy; however, she was able to express a few mL of colostrum with this pumping session (her 3rd time doing so). Hand expression was taught to Mom & she was able to return demonstrate. Mom was also shown how to assemble & use hand pump that was included in pump kit. Mom was using size 27 flanges when I entered the room, but that those were too large based on her nipple diameter. Mom was switched to size 24 flanges, instead, & she was comfortable with them. Mom was shown how to clean pump parts & how to put resulting colostrum in vial with numbered stickers & infant's label.  The colostrum vial was immediately placed in the refrigerator.   In the NICU booklet Mom was shown the pumping guidelines, pumping log, and milk storage instructions Colostrum stickers also provided.   Mom denies any further questions at this time. Mom stated that she has an Ameda pump at home.   Nicole Wiggins [facility] 08/07/2017, 4:24 PM

## 2017-08-07 NOTE — Progress Notes (Signed)
Swelling in left hand noted. Got in morning report her IV in this hand infiltrated last night and was removed. Applied ice to left hand and encouraged pt to elevate it. Will continue to monitor.

## 2017-08-08 NOTE — Progress Notes (Signed)
POSTOPERATIVE DAY # 2 S/P Primary LTCS for NRFHR, BPP 2/10, prior myomectomy, fibroid uterus, baby boy in NICU   S:         Reports feeling okay, having some incisional pain              Tolerating po intake / no nausea / no vomiting / + flatus / no BM  Denies dizziness, SOB, or CP             Bleeding is light, moderate             Pain controlled with Motrin and Percocet             Up ad lib / ambulatory/ voiding QS  Newborn in NICU - per patient he is on room air and doing much better.  She is pumping colostrum. Requests to see lactation again.    O:  VS: BP 113/76 (BP Location: Left Arm)   Pulse 93   Temp 98.4 F (36.9 C) (Oral)   Resp 16   Ht 5\' 5"  (1.651 m)   Wt 99.3 kg (219 lb)   LMP 09/14/2016 (Approximate)   SpO2 100%   Breastfeeding? Unknown   BMI 36.44 kg/m    LABS:               Recent Labs    08/06/17 2110 08/07/17 0525  WBC 13.7* 20.0*  HGB 11.9* 12.4  PLT 233 236               Bloodtype: --/--/A POS, A POS (12/01 2110)  Rubella: Immune (05/25 0000)                                             I&O: Intake/Output      12/02 0701 - 12/03 0700 12/03 0701 - 12/04 0700   P.O. 350    I.V. (mL/kg)     Total Intake(mL/kg) 350 (3.5)    Urine (mL/kg/hr) 1400 (0.6)    Blood     Total Output 1400    Net -1050                      Physical Exam:             Alert and Oriented X3  Lungs: Clear and unlabored  Heart: regular rate and rhythm / no murmurs  Abdomen: soft, non-tender, non-distended, active bowel sounds in all quadrants             Fundus: firm, non-tender, U-1             Dressing: honeycomb dsg with steristrips c/d/i              Incision:  approximated with sutures / no erythema / no ecchymosis / no drainage  Perineum: intact  Lochia: appropriate, no clots   Extremities: no edema, no calf pain or tenderness,   A:        POD # 2 S/P Primary LTCS for NRFHR, BPP 2/10, prior myomectomy, fibroid uterus            Anxiety - stable on Zoloft 50mg   daily   P:        Routine postoperative care              See lactation prior to discharge  Continue current care  Anticipate discharge  home tomorrow   Lars Pinks, MSN, CNM [facility]

## 2017-08-08 NOTE — Op Note (Signed)
NAME:  Nicole, Wiggins                       ACCOUNT NO.:  MEDICAL RECORD NO.:  [id number]  LOCATION:                                 FACILITY:  PHYSICIAN:  Servando Salina, M.D.    DATE OF BIRTH:  DATE OF PROCEDURE:  08/06/2017 DATE OF DISCHARGE:                              OPERATIVE REPORT   PREOPERATIVE DIAGNOSES:  Nonreassuring fetal testing, previous myomectomy, intrauterine gestation at 36 weeks, uterine fibroid complicating pregnancy.  PROCEDURES:  Primary cesarean section, Kerr hysterotomy.  POSTOPERATIVE DIAGNOSES:  Nonreassuring fetal testing, previous myomectomy, intrauterine gestation at 53 weeks,uterine fibroid complicating pregnancy.  ANESTHESIA:  Spinal.  SURGEON:  Servando Salina, M.D.  ASSISTANT:  Derrell Lolling, CNM  DESCRIPTION OF PROCEDURE:  Under adequate spinal anesthesia, the patient was placed in the supine position with a left lateral tilt.  She was sterilely prepped and draped in usual fashion.  An indwelling Foley catheter was sterilely placed.  Marcaine 0.25% was injected along the planned Pfannenstiel skin incision site.  Pfannenstiel skin incision was then made, carried down to the rectus fascia.  The rectus fascia was opened transversely.  Rectus fascia was then bluntly and sharply dissected off the rectus muscle in a superior and inferior fashion.  The rectus muscle was split in the midline.  The parietal peritoneum was entered sharply and extended.  A self-retaining Alexis retractor was then placed.  The vesicouterine peritoneum was opened transversely and displaced inferiorly with blunt dissection.  A curvilinear low- transverse uterine incision was then made and extended with bandage scissors.  Artificial rupture of membranes was done.  Clear copious amniotic fluid was noted.  Subsequent delivery of a live female from the right occiput posterior position with multiple cords around the foot were encountered.  The baby was bulb suctioned on  the abdomen.  The cord was clamped, cut.  The baby was transferred to the awaiting pediatricians, assigned Apgars of 3,5 and 7 at one,five minutes and ten minutes respectively. Cord pH was 7.0.  The placenta was manually removed.  Uterine cavity was cleaned of debris. Uterine incision had no extension, was closed in 2 layers, the first layer with 0 Monocryl running lock stitch.  Second layer was imbricated using 0 Monocryl suture.  Several fibroids were noted, the largest was a posterior subserosal, about 6 to 7 cm.  Some intramural fibroids were noted.  Left ovary was normal.  Right ovary had some adhesions surrounding it, plastered to the posterior wall.  Tubes were otherwise more normal on the left than the right.  The abdomen was irrigated and suctioned of debris.  Interceed was placed on the lower uterine segment in an inverted T-fashion.  The Alexis retractor was then removed.  The parietal peritoneum was then closed with 2-0 Vicryl.  The rectus fascia was closed with 0 Vicryl x2.  The subcutaneous area was irrigated. Small bleeders cauterized.  Interrupted 2-0 plain sutures placed and the skin approximated using 4-0 Vicryl subcuticular closure.  Steri-Strips and benzoin were placed.  SPECIMENS:  Placenta sent to Pathology.  ESTIMATED BLOOD LOSS:  1316 mL.  INTRAOPERATIVE FLUIDS:  1300 ml.  URINE OUTPUT:  450  mL.  Sponge and instrument counts x2 were correct.  COMPLICATIONS:  None.  The patient tolerated the procedure well, was transferred to the recovery room in stable condition.  Baby went to the NICU.     Servando Salina, M.D.     [facility]  D:  08/06/2017  T:  08/07/2017  Job:  [id number]

## 2017-08-09 MED ORDER — OXYCODONE-ACETAMINOPHEN 5-325 MG PO TABS: 1.0000 | ORAL_TABLET | ORAL | 0 refills | Status: DC | PRN

## 2017-08-09 MED ORDER — IBUPROFEN 600 MG PO TABS: 600.0000 mg | ORAL_TABLET | Freq: Four times a day (QID) | ORAL | 0 refills | Status: AC

## 2017-08-09 NOTE — Progress Notes (Signed)
Nicole Wiggins was referred for history of depression/anxiety.  Referral is screened out by Clinical Social Worker because none of the following criteria appear to apply and  there are no reports impacting the pregnancy or her transition to the postpartum period. CSW does not deem it clinically necessary to further investigate at this time.  -History of anxiety/depression during this pregnancy, or of post-partum depression.  - Diagnosis of anxiety and/or depression within last 3 years.-  - History of depression due to pregnancy loss/loss of child or -Nicole Wiggins's symptoms are currently being treated with medication and/or therapy.  Managed with Zoloft and ativan, outpatient psych MD in place.    Please contact the Clinical Social Worker if needs arise or upon Nicole Wiggins request.    Lane Hacker, MSW Clinical Social Work: [employer]

## 2017-08-09 NOTE — Progress Notes (Signed)
POSTOPERATIVE DAY # 3 S/P Primary LTCS for NRFHR, BPP 2/10, prior myomectomy, fibroid uterus, baby boy in NICU "Izola Price"  S:         Reports feeling better today             Tolerating po intake / no nausea / no vomiting / + flatus / no BM  Denies dizziness, SOB, or CP             Bleeding is moderate             Pain controlled with Motrin and Percocet             Up ad lib / ambulatory/ voiding QS Newborn in NICU - per patient he is on room air and doing much better.  She is pumping colostrum.   Circumcision deferred while baby in NICU.    O:  VS: BP 129/80 (BP Location: Left Arm)   Pulse 64   Temp 98.5 F (36.9 C) (Oral)   Resp 18   Ht 5\' 5"  (1.651 m)   Wt 99.3 kg (219 lb)   LMP 09/14/2016 (Approximate)   SpO2 100%   Breastfeeding? Unknown   BMI 36.44 kg/m     LABS:               Recent Labs    08/06/17 2110 08/07/17 0525  WBC 13.7* 20.0*  HGB 11.9* 12.4  PLT 233 236               Bloodtype: --/--/A POS, A POS (12/01 2110)  Rubella: Immune (05/25 0000)                                                       Physical Exam:             Alert and Oriented X3  Lungs: Clear and unlabored  Heart: regular rate and rhythm / no murmurs  Abdomen: soft, non-tender, non-distended, active bowel sounds              Fundus: firm, non-tender, U-2             Dressing: honeycomb dressing with steri-strips c/d/i              Incision:  approximated with sutures / no erythema / no ecchymosis / no drainage  Perineum: intact  Lochia: appropriate no clots   Extremities: noedema, no calf pain or tenderness,   A:        POD # 3 S/P Primary LTCS for NRFHR, BPP 2/10, prior myomectomy, fibroid uterus            Anxiety - stable on Zoloft 50mg  daily    P:        Routine postoperative care              Discharge home today  [facility] discharge book and instructions reviewed   F/u with Dr. Garwin Brothers in 6 weeks.  Encouraged f/u with psychiatrist in 2-4 weeks.   Lars Pinks, MSN, CNM [facility]

## 2017-08-09 NOTE — Progress Notes (Signed)
Teaching complete  Pt to meet husband in nicu  And return for belongings

## 2017-08-09 NOTE — Lactation Note (Signed)
This note was copied from a baby's chart. Lactation Consultation Note  Patient Name: Boy Reily Treloar AXENM'M Date: 08/09/2017 Reason for consult: Follow-up assessment;NICU baby;Late-preterm 34-36.6wks   Follow up with mom of 38 hour old infant. Mom reports she is pumping about every 3 hours and colostrum is increasing. She reports she is hand expressing post pumping. Mom pleased to see colostrum volumes increasing.   Mom has bottles and labels for home use. Infant is feeding from a bottle mom is anxious to ger him to the breast. Enc mom to ask NICU when he can BF.  Mom has Ahmeda pump for home use. Mom informed to take all pump tubings home from hospital. Enc mom to use [employer] pumps in the NICU when visiting infant in the NICU.   Mom reports she feels she has all she needs at this time. Mom without further questions/concerns at this time. Enc mom to call with any questions/concerns or for latch assistance in the NICU prn.    Maternal Data Formula Feeding for Exclusion: No Has patient been taught Hand Expression?: Yes Does the patient have breastfeeding experience prior to this delivery?: No  Feeding Feeding Type: Donor Breast Milk Nipple Type: Slow - flow Length of feed: 30 min  LATCH Score                   Interventions    Lactation Tools Discussed/Used Pump Review: Milk Storage;Setup, frequency, and cleaning Initiated by:: Reviewed and encouraged every 2-3 hours   Consult Status Consult Status: PRN Follow-up type: Call as needed    Donn Pierini 08/09/2017, 9:12 AM

## 2017-08-09 NOTE — Discharge Summary (Signed)
Obstetric Discharge Summary   Patient Name: Nicole Wiggins DOB: 1980/11/07 MRN: [id number]  Date of Admission: 08/06/2017 Date of Discharge: 08/09/2017 Date of Delivery: 08/06/17 Gestational Age at Delivery: [redacted]w[redacted]d  Primary OB: [facility] - Dr. Garwin Brothers  Antepartum complications:  - IVF/ICSI pregnancy  - Previous myomectomy  - Fibroid uterus  - Anxiety on Zoloft - GBS Positive  Prenatal Labs:  ABO, Rh: A/Positive/-- (05/25 0000) Antibody: Negative (05/25 0000) Rubella: Immune (05/25 0000) RPR: Nonreactive (05/25 0000)  HBsAg: Negative (05/25 0000)  HIV: Non-reactive (05/25 0000)  GBS:   positive  Admitting Diagnosis: BPP 2/10 at 36 weeks; previous myomectomy, decreased fetal movement  Secondary Diagnoses: Uterine fibroid affecting pregnancy Insomnia Anxiety disorder GBS culture positive affecting pregnancy Date of Delivery: 08/06/17 Delivered By: Dr. Garwin Brothers Delivery Type: primary cesarean section, low transverse incision  Newborn Data: Live born female  Birth Weight: 6 lb 3.5 oz (2820 g) APGAR: 3, 5  Newborn Delivery   Birth date/time:  08/06/2017 22:56:00 Delivery type:  C-Section, Low Transverse C-section categorization:  Primary     Postpartum Course  (Cesarean Section):  Patient had an uncomplicated postpartum course.  By time of discharge on POD#3, her pain was controlled on oral pain medications; she had appropriate lochia and was ambulating, voiding without difficulty, tolerating regular diet and passing flatus.   She was deemed stable for discharge to home.     Labs: CBC Latest Ref Rng & Units 08/07/2017 08/06/2017 06/06/2017  WBC 4.0 - 10.5 K/uL 20.0(H) 13.7(H) 13.1(H)  Hemoglobin 12.0 - 15.0 g/dL 12.4 11.9(L) 11.1(L)  Hematocrit 36.0 - 46.0 % 38.3 36.5 33.8(L)  Platelets 150 - 400 K/uL 236 233 235   A POS  Physical exam:  BP 129/80 (BP Location: Left Arm)   Pulse 64   Temp 98.5 F (36.9 C) (Oral)   Resp 18   Ht 5\' 5"  (1.651 m)   Wt 99.3 kg (219  lb)   LMP 09/14/2016 (Approximate)   SpO2 100%   Breastfeeding? Unknown   BMI 36.44 kg/m  General: alert and no distress Pulm: normal respiratory effort Lochia: appropriate Abdomen: soft, NT Uterine Fundus: firm, below umbilicus Perineum: healing well, no significant erythema, no significant edema Incision: c/d/i, healing well, no significant drainage, no dehiscence, no significant erythema Extremities: No evidence of DVT seen on physical exam. No lower extremity edema.  Disposition: stable, discharge to home Baby Feeding: breast milk Baby Disposition: NICU  Contraception: none  Rh Immune globulin given: N/A Rubella vaccine given: N/A Tdap vaccine given in AP or PP setting: UTD Flu vaccine given in AP or PP setting: UTD   Plan:  Nicole Wiggins was discharged to home in good condition. Follow-up appointment at [facility] in 6 weeks. Encouraged appt with psychiatrist in 2-4 weeks.   Discharge Instructions: Per After Visit Summary. Activity: Advance as tolerated. Pelvic rest for 6 weeks.  Refer to After Visit Summary Diet: Regular, Heart Healthy Discharge Medications: Allergies as of 08/09/2017   No Known Allergies     Medication List    STOP taking these medications   POLY-VITA/IRON PO   ranitidine 150 MG tablet Commonly known as:  ZANTAC     TAKE these medications   ibuprofen 600 MG tablet Commonly known as:  ADVIL,MOTRIN Take 1 tablet (600 mg total) by mouth every 6 (six) hours.   LORazepam 0.5 MG tablet Commonly known as:  ATIVAN Take 1 tablet (0.5 mg total) by mouth daily as needed for anxiety.   oxyCODONE-acetaminophen 5-325  MG tablet Commonly known as:  PERCOCET/ROXICET Take 1 tablet by mouth every 4 (four) hours as needed (pain scale 4-7).   prenatal multivitamin Tabs tablet Take 1 tablet by mouth daily at 12 noon.   sertraline 50 MG tablet Commonly known as:  ZOLOFT Take 50 mg by mouth daily.   Zinc 30 MG Caps Take 30 mg by mouth daily.    zolpidem 5 MG tablet Commonly known as:  AMBIEN Take 5 mg by mouth at bedtime as needed for sleep.      Outpatient follow up:  Follow-up Information    Nicole Salina, MD. Schedule an appointment as soon as possible for a visit in 6 week(s).   Specialty:  Obstetrics and Gynecology Why:  Postpartum visit  Contact information: [address] [place] [address] [phone]           Signed:  Lars Pinks, MSN, CNM [facility]

## 2017-08-13 ENCOUNTER — Ambulatory Visit: Payer: Self-pay

## 2017-08-13 NOTE — Lactation Note (Signed)
This note was copied from a baby's chart. Lactation Consultation Note  Patient Name: Nicole Wiggins TLXBW'I Date: 08/13/2017 Reason for consult: Follow-up assessment;Late-preterm 34-36.6wks;1st time breastfeeding  Baby is 40 days old, 2% weight loss.  Per NICU, baby feeding ad lib with bottle / EBM.  Last feeding at 0830 per [facility]  LC changed large wet, and yellow brown stool ( medium sized )  LC placed baby STS in cross cradle, and reviewed steps for latching.  Baby latched easily, swallows noted, baby sluggish with latch, but sustained  Depth, and mom was comfortable. Baby fell asleep by 7 mins, and released.  LC sized mom for a #24 NS in case the baby won't latch STS.  NICU RN aware of the out come of the latch.    Maternal Data    Feeding Feeding Type: Breast Fed Nipple Type: Slow - flow Length of feed: 7 min(swallows noted, increased with breast compressions )  LATCH Score Latch: Grasps breast easily, tongue down, lips flanged, rhythmical sucking.  Audible Swallowing: Spontaneous and intermittent  Type of Nipple: Everted at rest and after stimulation  Comfort (Breast/Nipple): Filling, red/small blisters or bruises, mild/mod discomfort  Hold (Positioning): Assistance needed to correctly position infant at breast and maintain latch.  LATCH Score: 8  Interventions Interventions: Breast feeding basics reviewed;Assisted with latch;Skin to skin;Breast massage;Hand express;Breast compression;Adjust position;Position options  Lactation Tools Discussed/Used WIC Program: No   Consult Status Consult Status: PRN Follow-up type: Call as needed    Myer Haff 08/13/2017, 12:11 PM

## 2017-08-16 ENCOUNTER — Encounter (HOSPITAL_COMMUNITY)
Admission: RE | Admit: 2017-08-16 | Discharge: 2017-08-16 | Disposition: A | Discharge status: Home / Self Care | Payer: Federal, State, Local not specified - PPO | Source: Ambulatory Visit

## 2017-08-17 ENCOUNTER — Inpatient Hospital Stay (HOSPITAL_COMMUNITY)
Admission: RE | Admit: 2017-08-17 | Payer: Federal, State, Local not specified - PPO | Source: Ambulatory Visit | Admitting: Obstetrics and Gynecology

## 2017-09-05 DIAGNOSIS — F411 Generalized anxiety disorder: Secondary | ICD-10-CM | POA: Diagnosis not present

## 2017-09-05 DIAGNOSIS — G4700 Insomnia, unspecified: Secondary | ICD-10-CM | POA: Diagnosis not present

## 2017-09-14 DIAGNOSIS — Z130 Encounter for screening for diseases of the blood and blood-forming organs and certain disorders involving the immune mechanism: Secondary | ICD-10-CM | POA: Diagnosis not present

## 2017-09-14 DIAGNOSIS — K080 Exfoliation of teeth due to systemic causes: Secondary | ICD-10-CM | POA: Diagnosis not present

## 2017-09-25 IMAGING — MR MR ABDOMEN WO/W CM
17 series · 48 of 48 positions shown · IV contrast (7 ML EOVIST)
Comparison: CT 10/01/2016

CLINICAL DATA: Indeterminate liver mass on CT. MRI recommended for
further characterization. 36-year-old female.

EXAM:
MRI ABDOMEN WITHOUT AND WITH CONTRAST
TECHNIQUE: Multiplanar multisequence MR imaging of the abdomen was performed
both before and after the administration of intravenous contrast.
CONTRAST:  7 mL Eovist

[Series 3: T2 · coronal · 5.0mm · 1.56mm/px · 1 of 36 slices shown (1 of 3)]
[im 1/36]
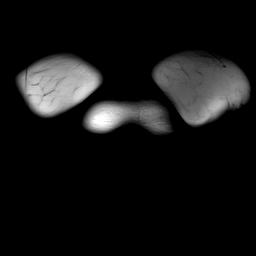

[Series 4: T1 · axial · 3.0mm · 1.19mm/px · z∈[-49,+164]mm · 5 of 144 slices shown]
[im 1/144]
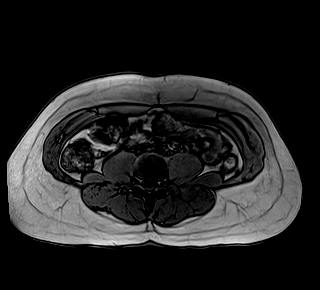
[im 36/144]
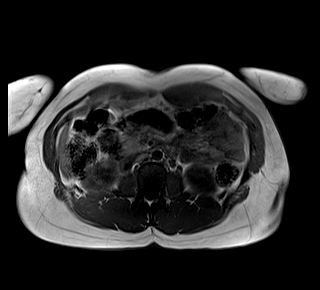
[im 72/144]
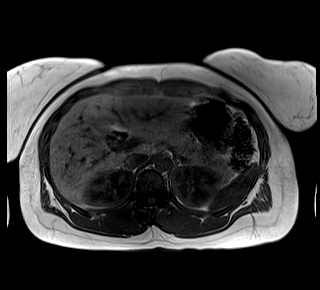
[im 108/144]
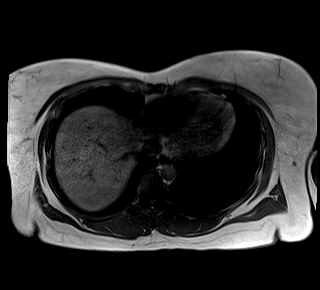
[im 144/144]
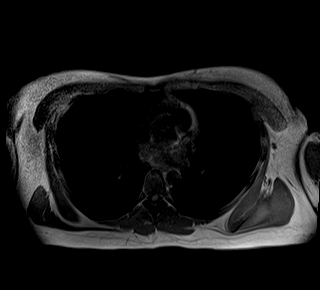

[Series 5: cor in and · coronal · 3.0mm · 1.19mm/px · 5 of 144 slices shown]
[im 1/144]
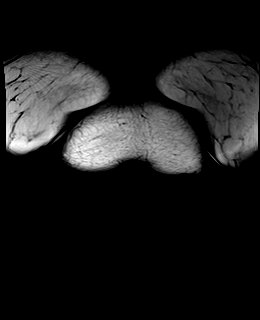
[im 36/144]
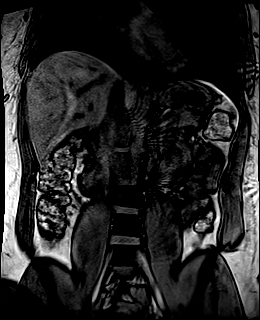
[im 72/144]
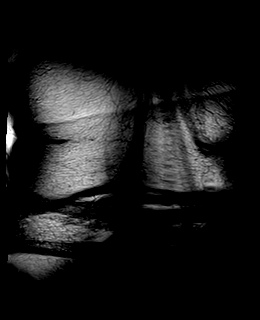
[im 108/144]
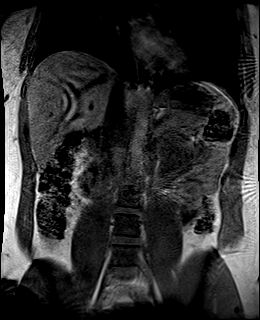
[im 144/144]
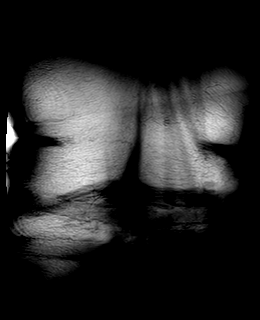

[Series 6: T1 dynamic · axial · non-contrast · 3.0mm · 1.19mm/px · z∈[-62,+151]mm · 3 of 72 slices shown]
[im 1/72]
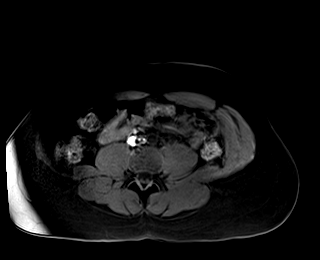
[im 36/72]
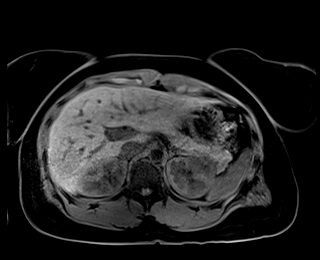
[im 72/72]
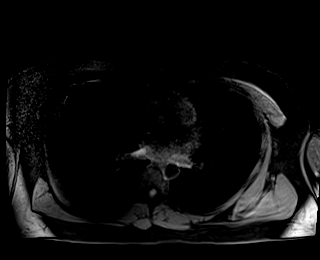

[Series 7: T1 dynamic post-contrast · axial · 3.0mm · 1.19mm/px · z∈[-62,+151]mm · 3 of 72 slices shown (1 of 9)]
[im 1/72]
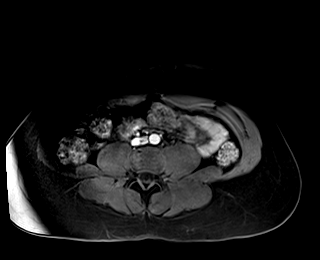
[im 36/72]
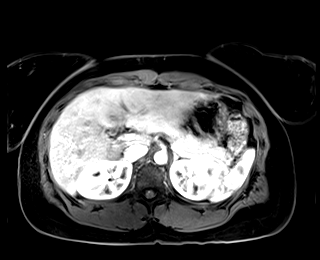
[im 72/72]
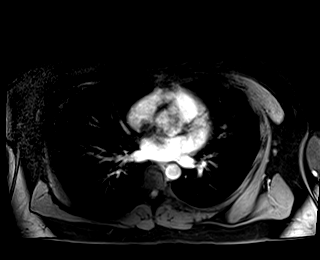

[Series 8: T1 dynamic post-contrast · axial · 3.0mm · 1.19mm/px · z∈[-62,+151]mm · 3 of 72 slices shown (2 of 9)]
[im 1/72]
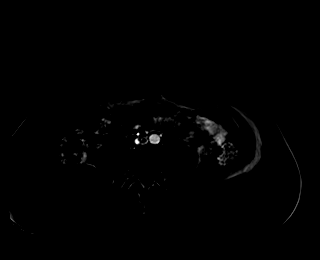
[im 36/72]
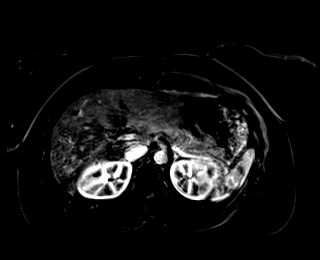
[im 72/72]
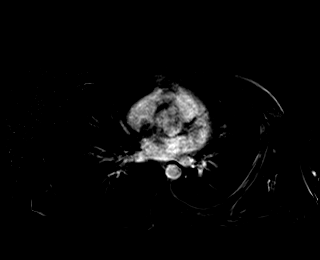

[Series 9: T1 dynamic post-contrast · axial · 3.0mm · 1.19mm/px · z∈[-62,+151]mm · 3 of 72 slices shown (3 of 9)]
[im 1/72]
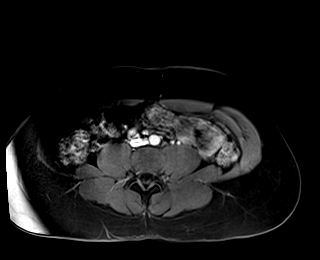
[im 36/72]
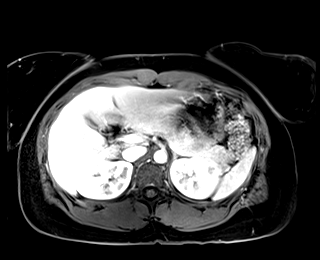
[im 72/72]
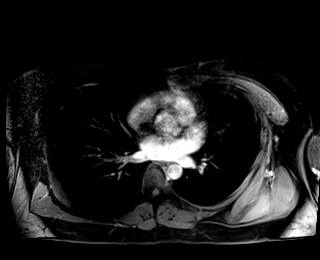

[Series 10: T1 dynamic post-contrast · axial · 3.0mm · 1.19mm/px · z∈[-62,+151]mm · 3 of 72 slices shown (4 of 9)]
[im 1/72]
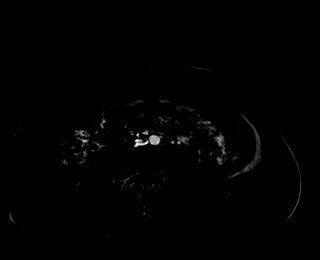
[im 36/72]
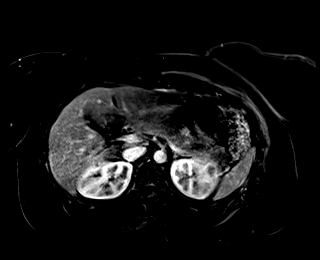
[im 72/72]
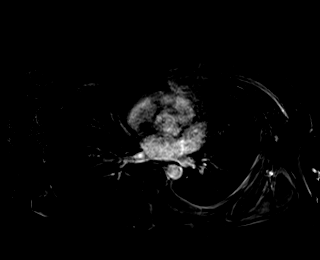

[Series 11: T1 dynamic post-contrast · axial · 3.0mm · 1.19mm/px · z∈[-62,+151]mm · 3 of 72 slices shown (5 of 9)]
[im 1/72]
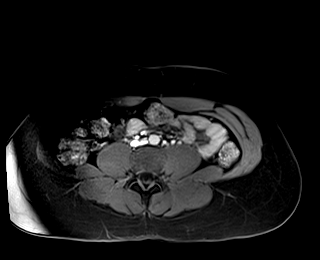
[im 36/72]
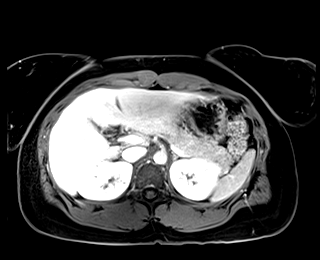
[im 72/72]
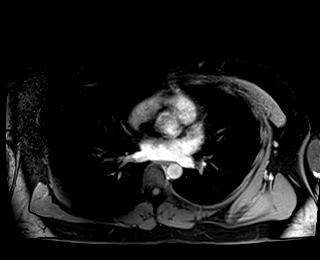

[Series 12: T1 dynamic post-contrast · axial · 3.0mm · 1.19mm/px · z∈[-62,+151]mm · 3 of 72 slices shown (6 of 9)]
[im 1/72]
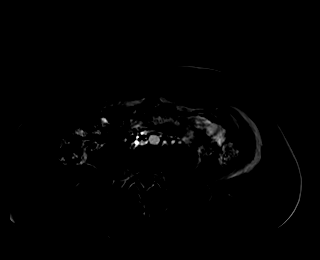
[im 36/72]
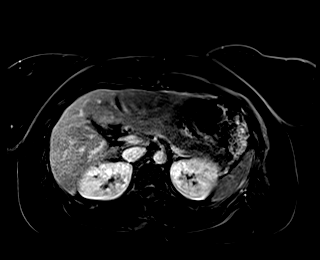
[im 72/72]
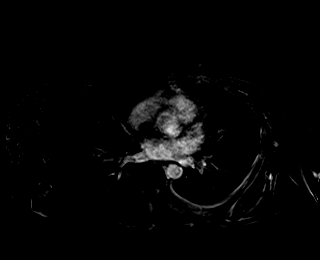

[Series 13: T1 dynamic post-contrast · coronal · 3.0mm · 1.25mm/px · 3 of 72 slices shown (7 of 9)]
[im 1/72]
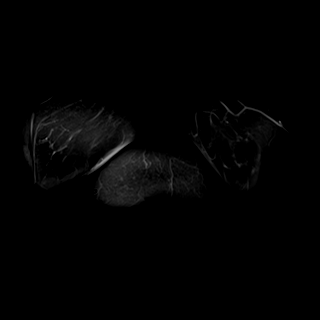
[im 36/72]
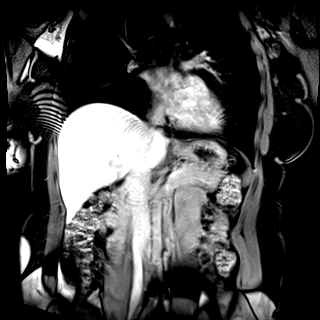
[im 72/72]
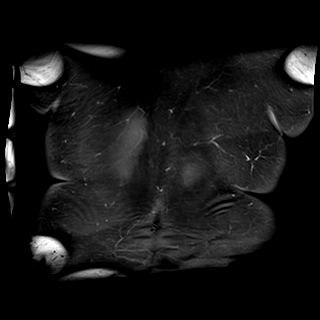

[Series 14: T2 · axial · 5.0mm · 1.48mm/px · 1 of 38 slices shown (2 of 3)]
[im 1/38]
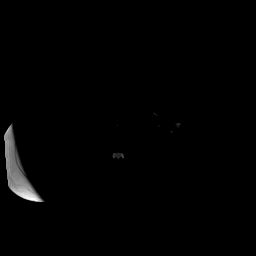

[Series 15: T2 · axial · 6.0mm · 1.22mm/px · 1 of 30 slices shown (3 of 3)]
[im 1/30]
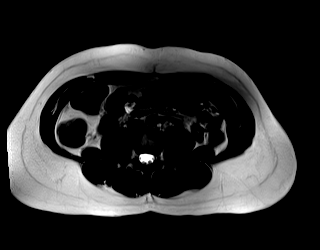

[Series 16: DWI · axial · 5.0mm · 1.42mm/px · z∈[-55,+155]mm · 4 of 108 slices shown (1 of 2)]
[im 1/108]
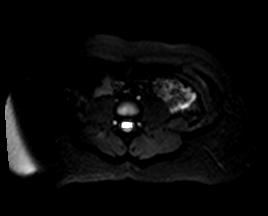
[im 36/108]
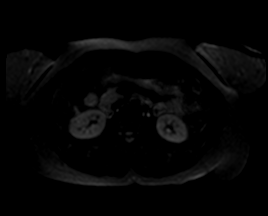
[im 72/108]
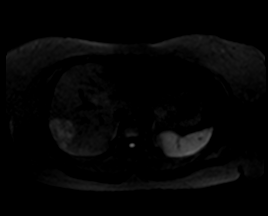
[im 108/108]
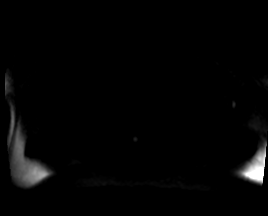

[Series 17: DWI · axial · 5.0mm · 1.42mm/px · 1 of 36 slices shown (2 of 2)]
[im 1/36]
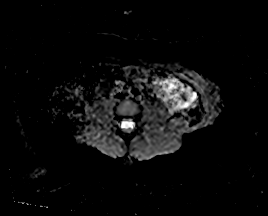

[Series 18: T1 dynamic post-contrast · axial · 3.0mm · 1.19mm/px · z∈[-62,+151]mm · 3 of 72 slices shown (8 of 9)]
[im 1/72]
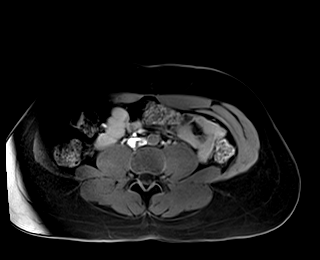
[im 36/72]
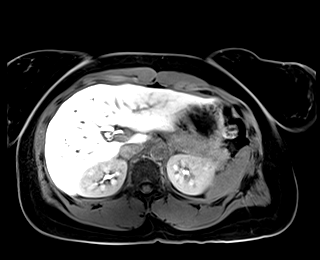
[im 72/72]
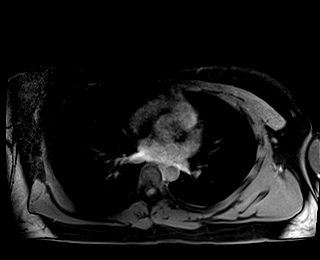

[Series 19: T1 dynamic post-contrast · axial · 3.0mm · 1.19mm/px · z∈[-62,+151]mm · 3 of 72 slices shown (9 of 9)]
[im 1/72]
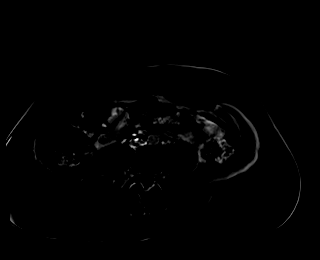
[im 36/72]
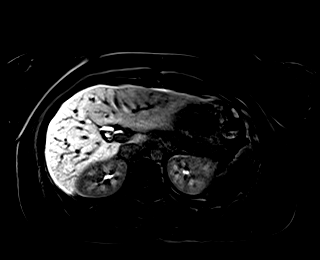
[im 72/72]
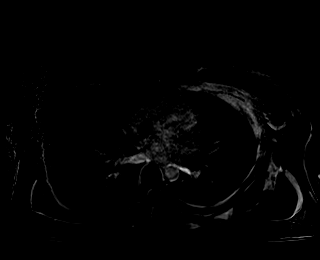

[48 of 48 positions shown; findings below may reference images not displayed]

FINDINGS: Lower chest:  Lung bases are clear.

Hepatobiliary: Within the RIGHT hepatic lobe a well-circumscribed
mass measures 3.8 x 4.7 cm and is intermediate hyperintense on T2
weighted imaging (image 14, series 14). There has central linear
intensity. This lesion demonstrates uniform early enhancement with
sparing of the central linear T2 hyperintensity. Lesion becomes
isointense to the venous vasculature on more delayed sequences. On
the 20 minutes delayed Eovist sequence lesion accumulate the
hepatocytes specific radiotracer. This combination findings is
consistent with focal nodular hyperplasia.

No additional hepatic lesions. Gallbladder is normal. Biliary tree
normal.

Pancreas: Normal pancreatic parenchymal intensity. No ductal
dilatation or inflammation.

Spleen: Normal spleen.

Adrenals/urinary tract: Adrenal glands are normal. Multiple
nonenhancing small bilateral renal cysts.

Stomach/Bowel: Stomach and limited of the small bowel is
unremarkable

Vascular/Lymphatic: Abdominal aortic normal caliber. No
retroperitoneal periportal lymphadenopathy.

Musculoskeletal: No aggressive osseous lesion
IMPRESSION: 1. Benign Focal nodular hyperplasia in the RIGHT hepatic lobe
corresponds the indeterminate hepatic lesion on comparison CT.
2. Normal liver otherwise.  Normal biliary tree and pancreas

## 2017-10-03 DIAGNOSIS — G4700 Insomnia, unspecified: Secondary | ICD-10-CM | POA: Diagnosis not present

## 2017-10-03 DIAGNOSIS — F411 Generalized anxiety disorder: Secondary | ICD-10-CM | POA: Diagnosis not present

## 2017-11-11 DIAGNOSIS — Z0000 Encounter for general adult medical examination without abnormal findings: Secondary | ICD-10-CM | POA: Diagnosis not present

## 2017-11-16 DIAGNOSIS — M25532 Pain in left wrist: Secondary | ICD-10-CM | POA: Diagnosis not present

## 2017-11-16 DIAGNOSIS — M654 Radial styloid tenosynovitis [de Quervain]: Secondary | ICD-10-CM | POA: Diagnosis not present

## 2018-01-04 DIAGNOSIS — J988 Other specified respiratory disorders: Secondary | ICD-10-CM | POA: Diagnosis not present

## 2018-03-15 DIAGNOSIS — K080 Exfoliation of teeth due to systemic causes: Secondary | ICD-10-CM | POA: Diagnosis not present

## 2018-05-31 DIAGNOSIS — H109 Unspecified conjunctivitis: Secondary | ICD-10-CM | POA: Diagnosis not present

## 2018-09-14 DIAGNOSIS — H6122 Impacted cerumen, left ear: Secondary | ICD-10-CM | POA: Diagnosis not present

## 2018-09-19 DIAGNOSIS — L218 Other seborrheic dermatitis: Secondary | ICD-10-CM | POA: Diagnosis not present

## 2018-10-03 DIAGNOSIS — K080 Exfoliation of teeth due to systemic causes: Secondary | ICD-10-CM | POA: Diagnosis not present

## 2018-11-13 DIAGNOSIS — Z0000 Encounter for general adult medical examination without abnormal findings: Secondary | ICD-10-CM | POA: Diagnosis not present

## 2018-11-13 DIAGNOSIS — E785 Hyperlipidemia, unspecified: Secondary | ICD-10-CM | POA: Diagnosis not present

## 2019-02-09 DIAGNOSIS — F411 Generalized anxiety disorder: Secondary | ICD-10-CM | POA: Diagnosis not present

## 2019-02-09 DIAGNOSIS — G4700 Insomnia, unspecified: Secondary | ICD-10-CM | POA: Diagnosis not present

## 2019-03-16 DIAGNOSIS — F411 Generalized anxiety disorder: Secondary | ICD-10-CM | POA: Diagnosis not present

## 2019-03-16 DIAGNOSIS — G4700 Insomnia, unspecified: Secondary | ICD-10-CM | POA: Diagnosis not present

## 2019-05-08 DIAGNOSIS — R079 Chest pain, unspecified: Secondary | ICD-10-CM | POA: Diagnosis not present

## 2019-05-08 DIAGNOSIS — Z23 Encounter for immunization: Secondary | ICD-10-CM | POA: Diagnosis not present

## 2019-06-08 DIAGNOSIS — L292 Pruritus vulvae: Secondary | ICD-10-CM | POA: Diagnosis not present

## 2019-06-08 DIAGNOSIS — N760 Acute vaginitis: Secondary | ICD-10-CM | POA: Diagnosis not present

## 2019-07-11 DIAGNOSIS — F411 Generalized anxiety disorder: Secondary | ICD-10-CM | POA: Diagnosis not present

## 2019-07-25 DIAGNOSIS — G4700 Insomnia, unspecified: Secondary | ICD-10-CM | POA: Diagnosis not present

## 2019-07-25 DIAGNOSIS — F411 Generalized anxiety disorder: Secondary | ICD-10-CM | POA: Diagnosis not present

## 2019-08-07 DIAGNOSIS — F411 Generalized anxiety disorder: Secondary | ICD-10-CM | POA: Diagnosis not present

## 2019-08-07 DIAGNOSIS — G4700 Insomnia, unspecified: Secondary | ICD-10-CM | POA: Diagnosis not present

## 2019-08-08 DIAGNOSIS — G4700 Insomnia, unspecified: Secondary | ICD-10-CM | POA: Diagnosis not present

## 2019-08-08 DIAGNOSIS — F411 Generalized anxiety disorder: Secondary | ICD-10-CM | POA: Diagnosis not present

## 2019-08-10 DIAGNOSIS — Z1231 Encounter for screening mammogram for malignant neoplasm of breast: Secondary | ICD-10-CM | POA: Diagnosis not present

## 2019-08-10 DIAGNOSIS — Z1151 Encounter for screening for human papillomavirus (HPV): Secondary | ICD-10-CM | POA: Diagnosis not present

## 2019-08-10 DIAGNOSIS — Z01419 Encounter for gynecological examination (general) (routine) without abnormal findings: Secondary | ICD-10-CM | POA: Diagnosis not present

## 2019-08-10 DIAGNOSIS — Z6834 Body mass index (BMI) 34.0-34.9, adult: Secondary | ICD-10-CM | POA: Diagnosis not present

## 2019-09-13 DIAGNOSIS — L709 Acne, unspecified: Secondary | ICD-10-CM | POA: Diagnosis not present

## 2019-09-13 DIAGNOSIS — G4700 Insomnia, unspecified: Secondary | ICD-10-CM | POA: Diagnosis not present

## 2019-09-13 DIAGNOSIS — F411 Generalized anxiety disorder: Secondary | ICD-10-CM | POA: Diagnosis not present

## 2019-09-13 DIAGNOSIS — E559 Vitamin D deficiency, unspecified: Secondary | ICD-10-CM | POA: Diagnosis not present

## 2019-09-13 DIAGNOSIS — E785 Hyperlipidemia, unspecified: Secondary | ICD-10-CM | POA: Diagnosis not present

## 2019-09-21 DIAGNOSIS — G4700 Insomnia, unspecified: Secondary | ICD-10-CM | POA: Diagnosis not present

## 2019-09-21 DIAGNOSIS — F411 Generalized anxiety disorder: Secondary | ICD-10-CM | POA: Diagnosis not present

## 2019-11-28 DIAGNOSIS — Z0000 Encounter for general adult medical examination without abnormal findings: Secondary | ICD-10-CM | POA: Diagnosis not present

## 2019-11-28 DIAGNOSIS — Z6836 Body mass index (BMI) 36.0-36.9, adult: Secondary | ICD-10-CM | POA: Diagnosis not present

## 2019-11-28 DIAGNOSIS — E785 Hyperlipidemia, unspecified: Secondary | ICD-10-CM | POA: Diagnosis not present

## 2019-12-21 DIAGNOSIS — G4700 Insomnia, unspecified: Secondary | ICD-10-CM | POA: Diagnosis not present

## 2019-12-21 DIAGNOSIS — F411 Generalized anxiety disorder: Secondary | ICD-10-CM | POA: Diagnosis not present

## 2020-02-18 DIAGNOSIS — L218 Other seborrheic dermatitis: Secondary | ICD-10-CM | POA: Diagnosis not present

## 2020-02-18 DIAGNOSIS — L700 Acne vulgaris: Secondary | ICD-10-CM | POA: Diagnosis not present

## 2020-04-11 DIAGNOSIS — G4700 Insomnia, unspecified: Secondary | ICD-10-CM | POA: Diagnosis not present

## 2020-04-11 DIAGNOSIS — F411 Generalized anxiety disorder: Secondary | ICD-10-CM | POA: Diagnosis not present

## 2020-04-23 DIAGNOSIS — L700 Acne vulgaris: Secondary | ICD-10-CM | POA: Diagnosis not present

## 2020-04-23 DIAGNOSIS — L218 Other seborrheic dermatitis: Secondary | ICD-10-CM | POA: Diagnosis not present

## 2020-05-22 DIAGNOSIS — L700 Acne vulgaris: Secondary | ICD-10-CM | POA: Diagnosis not present

## 2020-05-22 DIAGNOSIS — Z79899 Other long term (current) drug therapy: Secondary | ICD-10-CM | POA: Diagnosis not present

## 2020-08-13 DIAGNOSIS — G4700 Insomnia, unspecified: Secondary | ICD-10-CM | POA: Diagnosis not present

## 2020-08-13 DIAGNOSIS — F411 Generalized anxiety disorder: Secondary | ICD-10-CM | POA: Diagnosis not present

## 2020-10-22 DIAGNOSIS — L249 Irritant contact dermatitis, unspecified cause: Secondary | ICD-10-CM | POA: Diagnosis not present

## 2020-10-22 DIAGNOSIS — L700 Acne vulgaris: Secondary | ICD-10-CM | POA: Diagnosis not present

## 2020-12-02 DIAGNOSIS — Z124 Encounter for screening for malignant neoplasm of cervix: Secondary | ICD-10-CM | POA: Diagnosis not present

## 2020-12-02 DIAGNOSIS — Z6833 Body mass index (BMI) 33.0-33.9, adult: Secondary | ICD-10-CM | POA: Diagnosis not present

## 2020-12-02 DIAGNOSIS — Z1231 Encounter for screening mammogram for malignant neoplasm of breast: Secondary | ICD-10-CM | POA: Diagnosis not present

## 2020-12-02 DIAGNOSIS — Z01411 Encounter for gynecological examination (general) (routine) with abnormal findings: Secondary | ICD-10-CM | POA: Diagnosis not present

## 2020-12-02 DIAGNOSIS — Z01419 Encounter for gynecological examination (general) (routine) without abnormal findings: Secondary | ICD-10-CM | POA: Diagnosis not present

## 2020-12-02 DIAGNOSIS — Z113 Encounter for screening for infections with a predominantly sexual mode of transmission: Secondary | ICD-10-CM | POA: Diagnosis not present

## 2020-12-09 DIAGNOSIS — G4700 Insomnia, unspecified: Secondary | ICD-10-CM | POA: Diagnosis not present

## 2020-12-09 DIAGNOSIS — F411 Generalized anxiety disorder: Secondary | ICD-10-CM | POA: Diagnosis not present

## 2021-01-07 DIAGNOSIS — E785 Hyperlipidemia, unspecified: Secondary | ICD-10-CM | POA: Diagnosis not present

## 2021-01-07 DIAGNOSIS — Z0000 Encounter for general adult medical examination without abnormal findings: Secondary | ICD-10-CM | POA: Diagnosis not present

## 2021-01-07 DIAGNOSIS — R7309 Other abnormal glucose: Secondary | ICD-10-CM | POA: Diagnosis not present

## 2021-06-01 DIAGNOSIS — L249 Irritant contact dermatitis, unspecified cause: Secondary | ICD-10-CM | POA: Diagnosis not present

## 2021-06-01 DIAGNOSIS — L700 Acne vulgaris: Secondary | ICD-10-CM | POA: Diagnosis not present

## 2021-07-07 DIAGNOSIS — Z23 Encounter for immunization: Secondary | ICD-10-CM | POA: Diagnosis not present

## 2021-07-07 DIAGNOSIS — G4700 Insomnia, unspecified: Secondary | ICD-10-CM | POA: Diagnosis not present

## 2021-07-07 DIAGNOSIS — F411 Generalized anxiety disorder: Secondary | ICD-10-CM | POA: Diagnosis not present

## 2021-08-05 DIAGNOSIS — G4700 Insomnia, unspecified: Secondary | ICD-10-CM | POA: Diagnosis not present

## 2021-08-05 DIAGNOSIS — F411 Generalized anxiety disorder: Secondary | ICD-10-CM | POA: Diagnosis not present

## 2021-08-13 DIAGNOSIS — R0981 Nasal congestion: Secondary | ICD-10-CM | POA: Diagnosis not present

## 2021-08-13 DIAGNOSIS — H6123 Impacted cerumen, bilateral: Secondary | ICD-10-CM | POA: Diagnosis not present

## 2021-09-18 ENCOUNTER — Encounter: Payer: Self-pay | Admitting: Cardiology

## 2021-09-18 ENCOUNTER — Other Ambulatory Visit: Payer: Self-pay

## 2021-09-18 ENCOUNTER — Ambulatory Visit: Payer: 59 | Admitting: Cardiology

## 2021-09-18 VITALS — BP 109/70 | HR 91 | Temp 97.6°F | Resp 16 | Ht 65.0 in | Wt 209.6 lb

## 2021-09-18 DIAGNOSIS — E6609 Other obesity due to excess calories: Secondary | ICD-10-CM

## 2021-09-18 DIAGNOSIS — R072 Precordial pain: Secondary | ICD-10-CM

## 2021-09-18 DIAGNOSIS — Z6834 Body mass index (BMI) 34.0-34.9, adult: Secondary | ICD-10-CM

## 2021-09-18 MED ORDER — PANTOPRAZOLE SODIUM 20 MG PO TBEC: 20.0000 mg | DELAYED_RELEASE_TABLET | Freq: Every day | ORAL | 0 refills | Status: AC

## 2021-09-18 NOTE — Progress Notes (Signed)
Date:  09/18/2021   ID:  Nicole Wiggins, DOB [birth year], MRN [id number]  PCP:  London Pepper, MD  Cardiologist:  Rex Kras, DO, [facility] (established care 09/18/2021)  REASON FOR CONSULT: Chest pain  REQUESTING PHYSICIAN:  London Pepper, MD [address] [place],  [place] [address]  Chief Complaint  Patient presents with   Chest Pain   New Patient (Initial Visit)    HPI  Nicole Wiggins is a 41 y.o. African-American female who presents to the office with a chief complaint of " chest pain." Patient's past medical history and cardiovascular risk factors include: Hyperlipidemia, family history of heart disease, obesity due to excess calories.  She is referred to the office at the request of London Pepper, MD for evaluation of chest pain.  Patient has been experiencing chest pain for the last few weeks.  She first noticed it after eating a meal but now again after at any time.  She describes the discomfort as a achy-like sensation, intensity 3 out of 10, located substernally and at times either side of the chest, lasting for a few minutes, not brought on by effort related activities usually, does not resolve with rest, self-limited.  This past Wednesday she was at her exercise class and noticed her chest feeling heavier and therefore she went to the PCPs office for evaluation.  She was referred to the walk-in clinic thereafter and now to cardiology for further evaluation and management.  Patient is adopted but is aware that her biological parents do have family history of heart disease not sure if it is premature.  She would also been informed that since her age of 59 her bad cholesterol has been high.   Of note, patient is on spironolactone for acne and not blood pressure.  FUNCTIONAL STATUS: Works out twice twice a week she works out either with [employer] or kickboxing class for 45 minutes.  ALLERGIES: No Known Allergies  MEDICATION LIST PRIOR TO VISIT: Current Meds   Medication Sig   cholecalciferol (VITAMIN D3) 25 MCG (1000 UNIT) tablet Take 1 tablet by mouth daily at 12 noon.   doxepin (SINEQUAN) 10 MG capsule Take 20 mg by mouth at bedtime.   Fluocinolone Acetonide Scalp 0.01 % OIL Apply topically at bedtime as needed.   ibuprofen (ADVIL,MOTRIN) 600 MG tablet Take 1 tablet (600 mg total) by mouth every 6 (six) hours.   pantoprazole (PROTONIX) 20 MG tablet Take 1 tablet (20 mg total) by mouth daily.   spironolactone (ALDACTONE) 100 MG tablet Take 1 tablet by mouth daily at 12 noon.   Zinc 30 MG CAPS Take 30 mg by mouth daily.     PAST MEDICAL HISTORY: Past Medical History:  Diagnosis Date   Anxiety    Asherman's syndrome    Eczema    Neck, behind ears and scalp   Fibroid    Hyperlipidemia    Wears contact lenses     PAST SURGICAL HISTORY: Past Surgical History:  Procedure Laterality Date   CESAREAN SECTION N/A 08/06/2017   Procedure: Primary CESAREAN SECTION;  Surgeon: Servando Salina, MD;  Location: [place];  Service: Obstetrics;  Laterality: N/A;  EDD: 09/03/17   DX LAPAROSCOPY/  LAPAROTOMY MYOMECTOMY  2013   HYSTEROSCOPY N/A 07/04/2014   Procedure: HYSTEROSCOPY WITH LYSIS OF INTER UTERINE ADHESIONS;  Surgeon: Governor Specking, MD;  Location: [place];  Service: Gynecology;  Laterality: N/A;   LAPAROSCOPY N/A 07/04/2014   Procedure: LAPAROSCOPY WITH EXTENSIVE LYSIS OF  ADHESIONS, BILATERAL SALPINGO-OOPHOROLYSIS.;  Surgeon: Governor Specking, MD;  Location: [place];  Service: Gynecology;  Laterality: N/A;    FAMILY HISTORY: The patient family history includes Heart attack in her maternal grandmother; Heart disease in her maternal grandmother. She was adopted.  SOCIAL HISTORY:  The patient  reports that she has never smoked. She has never used smokeless tobacco. She reports that she does not drink alcohol and does not use drugs.  REVIEW OF SYSTEMS: Review of Systems  Constitutional:  Negative for chills and fever.  HENT:  Negative for hoarse voice and nosebleeds.   Eyes:  Negative for discharge, double vision and pain.  Cardiovascular:  Positive for chest pain. Negative for claudication, dyspnea on exertion, leg swelling, near-syncope, orthopnea, palpitations, paroxysmal nocturnal dyspnea and syncope.  Respiratory:  Negative for hemoptysis and shortness of breath.   Musculoskeletal:  Negative for muscle cramps and myalgias.  Gastrointestinal:  Negative for abdominal pain, constipation, diarrhea, hematemesis, hematochezia, melena, nausea and vomiting.  Neurological:  Negative for dizziness and light-headedness.   PHYSICAL EXAM: Vitals with BMI 09/18/2021 08/09/2017 08/09/2017  Height 5\' 5"  - -  Weight 209 lbs 10 oz - -  BMI 25.36 - -  Systolic 644 034 742  Diastolic 70 71 80  Pulse 91 96 64  Some encounter information is confidential and restricted. Go to Review Flowsheets activity to see all data.    CONSTITUTIONAL: Well-developed and well-nourished. No acute distress.  SKIN: Skin is warm and dry. No rash noted. No cyanosis. No pallor. No jaundice HEAD: Normocephalic and atraumatic.  EYES: No scleral icterus MOUTH/THROAT: Moist oral membranes.  NECK: No JVD present. No thyromegaly noted. No carotid bruits  LYMPHATIC: No visible cervical adenopathy.  CHEST Normal respiratory effort. No intercostal retractions  LUNGS: Clear to auscultation bilaterally.  No stridor. No wheezes. No rales.  CARDIOVASCULAR: Regular rate and rhythm, positive S1-S2, no murmurs rubs or gallops appreciated. ABDOMINAL: Soft, nontender, nondistended, positive bowel sounds in all 4 quadrants no apparent ascites.  EXTREMITIES: No peripheral edema, warm to touch, 2+ bilateral DP and PT pulses HEMATOLOGIC: No significant bruising NEUROLOGIC: Oriented to person, place, and time. Nonfocal. Normal muscle tone.  PSYCHIATRIC: Normal mood and affect. Normal behavior. Cooperative  CARDIAC  DATABASE: EKG: 09/18/2021 sinus tachycardia, 100 bpm, normal axis, without underlying ischemia injury pattern.  Echocardiogram: No results found for this or any previous visit from the past 1095 days.    Stress Testing: No results found for this or any previous visit from the past 1095 days.   Heart Catheterization: None  LABORATORY DATA: CBC Latest Ref Rng & Units 08/07/2017 08/06/2017 06/06/2017  WBC 4.0 - 10.5 K/uL 20.0(H) 13.7(H) 13.1(H)  Hemoglobin 12.0 - 15.0 g/dL 12.4 11.9(L) 11.1(L)  Hematocrit 36.0 - 46.0 % 38.3 36.5 33.8(L)  Platelets 150 - 400 K/uL 236 233 235    CMP Latest Ref Rng & Units 06/06/2017 11/01/2014 10/15/2013  Glucose 65 - 99 mg/dL 101(H) 72 75  BUN 6 - 20 mg/dL 10 12 13   Creatinine 0.44 - 1.00 mg/dL 0.85 0.86 0.85  Sodium 135 - 145 mmol/L 135 138 135  Potassium 3.5 - 5.1 mmol/L 3.5 4.3 4.3  Chloride 101 - 111 mmol/L 103 102 100  CO2 22 - 32 mmol/L 21(L) 28 24  Calcium 8.9 - 10.3 mg/dL 9.6 9.6 9.7  Total Protein 6.5 - 8.1 g/dL 7.4 7.5 7.8  Total Bilirubin 0.3 - 1.2 mg/dL 0.4 0.5 0.4  Alkaline Phos 38 - 126 U/L 52 54  53  AST 15 - 41 U/L 21 22 24   ALT 14 - 54 U/L 24 14 21     Lipid Panel     Component Value Date/Time   CHOL 252 (H) 11/01/2014 1349   TRIG 46 11/01/2014 1349   HDL 87 11/01/2014 1349   CHOLHDL 2.9 11/01/2014 1349   VLDL 9 11/01/2014 1349   LDLCALC 156 (H) 11/01/2014 1349    No components found for: NTPROBNP No results for input(s): PROBNP in the last 8760 hours. No results for input(s): TSH in the last 8760 hours.  BMP No results for input(s): NA, K, CL, CO2, GLUCOSE, BUN, CREATININE, CALCIUM, GFRNONAA, GFRAA in the last 8760 hours.  HEMOGLOBIN A1C No results found for: HGBA1C, MPG  External Labs: Collected: 09/17/2021 provided by PCP. D-dimer <0.2 (within normal limits). Collected 01/07/2021 provided by PCP. Hemoglobin A1c 5.9. Total cholesterol 228, HDL 62, triglycerides 49, non-HDL 166, LDL  158  IMPRESSION:    RECOMMENDATIONS: Catrena Vari is a 41 y.o. African-American female whose past medical history and cardiac risk factors include: Hyperlipidemia, family history of heart disease, obesity due to excess calories.  Precordial pain Likely noncardiac. EKG: Sinus tach, without underlying injury pattern. Echocardiogram will be ordered to evaluate for structural heart disease and left ventricular systolic function. Plan exercise treadmill stress test to evaluate for functional status and exercise-induced ischemia Patient's LDL is 158 mg/dL, family history of coronary artery disease, whether CAD is premature in the family is unknown as she is adopted.  And therefore the shared decision was to proceed with coronary calcium score for further risk stratification. 10-year estimated ASCVD risk score less than 1%. Given her symptoms recommend Protonix 20 mg p.o. daily.  Management of heartburn discussed, referred back to PCP for additional evaluation. Educated on seeking medical attention sooner by going to the closest ER via EMS if the symptoms increase in intensity, frequency, duration, or has typical chest pain as discussed in the office.  Patient verbalized understanding. Further recommendations to follow  Class 1 obesity due to excess calories without serious comorbidity with body mass index (BMI) of 34.0 to 34.9 in adult Body mass index is 34.88 kg/m. I reviewed with the patient the importance of diet, regular physical activity/exercise, weight loss.   Patient is educated on increasing physical activity gradually as tolerated.  With the goal of moderate intensity exercise for 30 minutes a day 5 days a week.  As part of this initial consultation reviewed outside records provided by PCP which included office note, labs these findings have been summarized and noted above for further reference.  Discussed disease management, ordering diagnostic testing, coordination of care and patient  education provided as a part of today's encounter.   FINAL MEDICATION LIST END OF ENCOUNTER: Meds ordered this encounter  Medications   pantoprazole (PROTONIX) 20 MG tablet    Sig: Take 1 tablet (20 mg total) by mouth daily.    Dispense:  30 tablet    Refill:  0    Medications Discontinued During This Encounter  Medication Reason   LORazepam (ATIVAN) 0.5 MG tablet    oxyCODONE-acetaminophen (PERCOCET/ROXICET) 5-325 MG tablet    Prenatal Vit-Fe Fumarate-FA (PRENATAL MULTIVITAMIN) TABS tablet    sertraline (ZOLOFT) 50 MG tablet    zolpidem (AMBIEN) 5 MG tablet      Current Outpatient Medications:    cholecalciferol (VITAMIN D3) 25 MCG (1000 UNIT) tablet, Take 1 tablet by mouth daily at 12 noon., Disp: , Rfl:    doxepin (SINEQUAN)  10 MG capsule, Take 20 mg by mouth at bedtime., Disp: , Rfl:    Fluocinolone Acetonide Scalp 0.01 % OIL, Apply topically at bedtime as needed., Disp: , Rfl:    ibuprofen (ADVIL,MOTRIN) 600 MG tablet, Take 1 tablet (600 mg total) by mouth every 6 (six) hours., Disp: 30 tablet, Rfl: 0   pantoprazole (PROTONIX) 20 MG tablet, Take 1 tablet (20 mg total) by mouth daily., Disp: 30 tablet, Rfl: 0   spironolactone (ALDACTONE) 100 MG tablet, Take 1 tablet by mouth daily at 12 noon., Disp: , Rfl:    Zinc 30 MG CAPS, Take 30 mg by mouth daily., Disp: , Rfl:   Orders Placed This Encounter  Procedures   CT CARDIAC SCORING ([facility] LOCATIONS ONLY)   Pregnancy, urine   [facility] CARDIAC STRESS TEST   EKG 12-Lead   [facility]V ECHOCARDIOGRAM COMPLETE    There are no Patient Instructions on file for this visit.   --Continue cardiac medications as reconciled in final medication list. --Return in about 4 weeks (around 10/16/2021) for Follow up, Chest pain, Review test results. Or sooner if needed. --Continue follow-up with your primary care physician regarding the management of your other chronic comorbid conditions.  Patient's questions and concerns were addressed to her satisfaction.  She voices understanding of the instructions provided during this encounter.   This note was created using a voice recognition software as a result there may be grammatical errors inadvertently enclosed that do not reflect the nature of this encounter. Every attempt is made to correct such errors.  Rex Kras, [place], [facility]  Pager: [phone] Office: [phone]

## 2021-10-12 ENCOUNTER — Ambulatory Visit
Admission: RE | Admit: 2021-10-12 | Discharge: 2021-10-12 | Disposition: A | Discharge status: Home / Self Care | Payer: Self-pay | Source: Ambulatory Visit | Attending: Cardiology | Admitting: Cardiology

## 2021-10-12 DIAGNOSIS — R072 Precordial pain: Secondary | ICD-10-CM

## 2021-10-23 ENCOUNTER — Other Ambulatory Visit: Payer: 59

## 2021-10-29 ENCOUNTER — Ambulatory Visit: Payer: 59 | Admitting: Cardiology

## 2021-11-03 ENCOUNTER — Ambulatory Visit: Payer: 59

## 2021-11-03 ENCOUNTER — Other Ambulatory Visit: Payer: 59

## 2021-11-03 ENCOUNTER — Other Ambulatory Visit: Payer: Self-pay

## 2021-11-03 DIAGNOSIS — R072 Precordial pain: Secondary | ICD-10-CM

## 2021-11-04 ENCOUNTER — Ambulatory Visit: Payer: 59

## 2021-11-04 DIAGNOSIS — R072 Precordial pain: Secondary | ICD-10-CM

## 2021-11-11 ENCOUNTER — Other Ambulatory Visit: Payer: Self-pay

## 2021-11-11 ENCOUNTER — Ambulatory Visit: Payer: 59 | Admitting: Cardiology

## 2021-11-11 ENCOUNTER — Encounter: Payer: Self-pay | Admitting: Cardiology

## 2021-11-11 VITALS — BP 117/74 | HR 80 | Temp 97.5°F | Resp 16 | Ht 65.0 in | Wt 207.2 lb

## 2021-11-11 DIAGNOSIS — R072 Precordial pain: Secondary | ICD-10-CM

## 2021-11-11 DIAGNOSIS — Z712 Person consulting for explanation of examination or test findings: Secondary | ICD-10-CM

## 2021-11-11 NOTE — Progress Notes (Signed)
ID:  [name], DOB December 20, 1980, MRN [id number]  PCP:  London Pepper, MD  Cardiologist:  Rex Kras, DO, [facility] (established care 09/18/2021)  Date: 11/11/21 Last Office Visit: 09/18/2021  Chief Complaint  Patient presents with   Chest Pain   Results    HPI  Nicole Wiggins is a 41 y.o. African-American female who presents to the office with a chief complaint of " reevaluation of chest pain and discuss test results." Patient's past medical history and cardiovascular risk factors include: Hyperlipidemia, family history of heart disease, obesity due to excess calories.  Patient was seen in consultation back in January 2023 for evaluation of precordial discomfort which appeared to be noncardiac based on her symptomatology.  However given her risk factors and concern the shared decision was to proceed with an echocardiogram and GXT.  Echocardiogram notes preserved LVEF, no significant valvular heart disease and GXT overall low risk study.  Coronary calcium score is also 0 which was performed given her history of hyperlipidemia currently not on pharmacological therapy.  Since last office visit she denies any chest pain or heart failure symptoms.  She started lifestyle interventions has lost approximately 2 pounds.  Of note, patient is on spironolactone for acne and not for blood pressure management.  FUNCTIONAL STATUS: Works out twice twice a week she works out either with [employer] or kickboxing class for 45 minutes.  ALLERGIES: No Known Allergies  MEDICATION LIST PRIOR TO VISIT: Current Meds  Medication Sig   cholecalciferol (VITAMIN D3) 25 MCG (1000 UNIT) tablet Take 1 tablet by mouth daily at 12 noon.   doxepin (SINEQUAN) 10 MG capsule Take 20 mg by mouth at bedtime.   Fluocinolone Acetonide Scalp 0.01 % OIL Apply topically at bedtime as needed.   ibuprofen (ADVIL,MOTRIN) 600 MG tablet Take 1 tablet (600 mg total) by mouth every 6 (six) hours.   pantoprazole (PROTONIX) 20 MG  tablet Take 1 tablet (20 mg total) by mouth daily.   Quercetin 500 MG CAPS Take 1 capsule by mouth daily.   spironolactone (ALDACTONE) 100 MG tablet Take 1 tablet by mouth daily at 12 noon.   Zinc 30 MG CAPS Take 30 mg by mouth daily.     PAST MEDICAL HISTORY: Past Medical History:  Diagnosis Date   Anxiety    Asherman's syndrome    Eczema    Neck, behind ears and scalp   Fibroid    Hyperlipidemia    Wears contact lenses     PAST SURGICAL HISTORY: Past Surgical History:  Procedure Laterality Date   CESAREAN SECTION N/A 08/06/2017   Procedure: Primary CESAREAN SECTION;  Surgeon: Servando Salina, MD;  Location: [place];  Service: Obstetrics;  Laterality: N/A;  EDD: 09/03/17   DX LAPAROSCOPY/  LAPAROTOMY MYOMECTOMY  2013   HYSTEROSCOPY N/A 07/04/2014   Procedure: HYSTEROSCOPY WITH LYSIS OF INTER UTERINE ADHESIONS;  Surgeon: Governor Specking, MD;  Location: [place];  Service: Gynecology;  Laterality: N/A;   LAPAROSCOPY N/A 07/04/2014   Procedure: LAPAROSCOPY WITH EXTENSIVE LYSIS OF ADHESIONS, BILATERAL SALPINGO-OOPHOROLYSIS.;  Surgeon: Governor Specking, MD;  Location: [place];  Service: Gynecology;  Laterality: N/A;    FAMILY HISTORY: The patient family history includes Heart attack in her maternal grandmother; Heart disease in her maternal grandmother. She was adopted.  SOCIAL HISTORY:  The patient  reports that she has never smoked. She has never used smokeless tobacco. She reports that she does not drink alcohol and does not use drugs.  REVIEW OF SYSTEMS: Review of Systems  Cardiovascular:  Negative for chest pain, dyspnea on exertion, leg swelling, near-syncope, orthopnea, palpitations, paroxysmal nocturnal dyspnea and syncope.   PHYSICAL EXAM: Vitals with BMI 11/11/2021 09/18/2021 08/09/2017  Height '5\' 5"'$  '5\' 5"'$  -  Weight 207 lbs 3 oz 209 lbs 10 oz -  BMI 97.02 63.78 -  Systolic 588 502 774  Diastolic 74 70 71  Pulse 80 91  96  Some encounter information is confidential and restricted. Go to Review Flowsheets activity to see all data.    CONSTITUTIONAL: Well-developed and well-nourished. No acute distress.  SKIN: Skin is warm and dry. No rash noted. No cyanosis. No pallor. No jaundice HEAD: Normocephalic and atraumatic.  EYES: No scleral icterus MOUTH/THROAT: Moist oral membranes.  NECK: No JVD present. No thyromegaly noted. No carotid bruits  LYMPHATIC: No visible cervical adenopathy.  CHEST Normal respiratory effort. No intercostal retractions  LUNGS: Clear to auscultation bilaterally.  No stridor. No wheezes. No rales.  CARDIOVASCULAR: Regular rate and rhythm, positive S1-S2, no murmurs rubs or gallops appreciated. ABDOMINAL: Soft, nontender, nondistended, positive bowel sounds in all 4 quadrants no apparent ascites.  EXTREMITIES: No peripheral edema, warm to touch, 2+ bilateral DP and PT pulses HEMATOLOGIC: No significant bruising NEUROLOGIC: Oriented to person, place, and time. Nonfocal. Normal muscle tone.  PSYCHIATRIC: Normal mood and affect. Normal behavior. Cooperative  CARDIAC DATABASE: EKG: 09/18/2021 sinus tachycardia, 100 bpm, normal axis, without underlying ischemia injury pattern.  Echocardiogram: 11/04/2021:  Normal LV systolic function with visual EF 60-65%. Left ventricle cavity  is normal in size. Normal left ventricular wall thickness. Normal global  wall motion. Normal diastolic filling pattern, normal LAP.  No significant valvular heart disease.  No prior study for comparison.    Stress Testing: Exercise treadmill stress test 11/03/2021: Exercise treadmill stress test performed using Bruce protocol.  Patient reached 7 METS, and 89% of age predicted maximum heart rate.  Exercise capacity was low.  No chest pain reported.  Normal heart rate and hemodynamic response. Stress EKG revealed no ischemic changes. Low risk study.  Heart Catheterization: None  Calcium Scoring 10/13/2021:   Total CAC 0 AU.  No extracardiac abnormality.   LABORATORY DATA: CBC Latest Ref Rng & Units 08/07/2017 08/06/2017 06/06/2017  WBC 4.0 - 10.5 K/uL 20.0(H) 13.7(H) 13.1(H)  Hemoglobin 12.0 - 15.0 g/dL 12.4 11.9(L) 11.1(L)  Hematocrit 36.0 - 46.0 % 38.3 36.5 33.8(L)  Platelets 150 - 400 K/uL 236 233 235    CMP Latest Ref Rng & Units 06/06/2017 11/01/2014 10/15/2013  Glucose 65 - 99 mg/dL 101(H) 72 75  BUN 6 - 20 mg/dL '10 12 13  '$ Creatinine 0.44 - 1.00 mg/dL 0.85 0.86 0.85  Sodium 135 - 145 mmol/L 135 138 135  Potassium 3.5 - 5.1 mmol/L 3.5 4.3 4.3  Chloride 101 - 111 mmol/L 103 102 100  CO2 22 - 32 mmol/L 21(L) 28 24  Calcium 8.9 - 10.3 mg/dL 9.6 9.6 9.7  Total Protein 6.5 - 8.1 g/dL 7.4 7.5 7.8  Total Bilirubin 0.3 - 1.2 mg/dL 0.4 0.5 0.4  Alkaline Phos 38 - 126 U/L 52 54 53  AST 15 - 41 U/L '21 22 24  '$ ALT 14 - 54 U/L '24 14 21    '$ Lipid Panel     Component Value Date/Time   CHOL 252 (H) 11/01/2014 1349   TRIG 46 11/01/2014 1349   HDL 87 11/01/2014 1349   CHOLHDL 2.9 11/01/2014 1349   VLDL 9 11/01/2014 1349  LDLCALC 156 (H) 11/01/2014 1349    No components found for: NTPROBNP No results for input(s): PROBNP in the last 8760 hours. No results for input(s): TSH in the last 8760 hours.  BMP No results for input(s): NA, K, CL, CO2, GLUCOSE, BUN, CREATININE, CALCIUM, GFRNONAA, GFRAA in the last 8760 hours.  HEMOGLOBIN A1C No results found for: HGBA1C, MPG  External Labs: Collected: 09/17/2021 provided by PCP. D-dimer <0.2 (within normal limits). Collected 01/07/2021 provided by PCP. Hemoglobin A1c 5.9. Total cholesterol 228, HDL 62, triglycerides 49, non-HDL 166, LDL 158  IMPRESSION:    ICD-10-CM   1. Precordial pain  R07.2     2. Encounter to discuss test results  Z71.2        RECOMMENDATIONS: Nicole Wiggins is a 41 y.o. African-American female whose past medical history and cardiac risk factors include: Hyperlipidemia, family history of heart disease, obesity due to  excess calories.  Precordial pain Resolved. EKG: Sinus tach without underlying injury pattern. Echo: Preserved LVEF, no significant valvular heart disease. GXT: Low risk study. Coronary calcium score: Total CAC 0 No additional cardiac work-up warranted at this time.  Patient's LDL was 158 mg/dL, she does have family history of CAD but unaware if the CAD is premature as she is adopted.  Total coronary calcium score is 0 and estimated 10-year risk of ASCVD based on current parameters is less than 1%.  Patient prefers to be on no pharmacological therapy and will work on lifestyle changes.  She is recommended to have annual lipid profiles as part of her well visit and if the numbers continue to uptrend may consider therapy.  In the meantime recommended foods that are low in cholesterol, red yeast rice, and increasing physical activity to 30 minutes a day 5 days a week.  Would like to see the patient on as-needed basis.  Her questions and concerns were addressed to her satisfaction.  FINAL MEDICATION LIST END OF ENCOUNTER: No orders of the defined types were placed in this encounter.   There are no discontinued medications.    Current Outpatient Medications:    cholecalciferol (VITAMIN D3) 25 MCG (1000 UNIT) tablet, Take 1 tablet by mouth daily at 12 noon., Disp: , Rfl:    doxepin (SINEQUAN) 10 MG capsule, Take 20 mg by mouth at bedtime., Disp: , Rfl:    Fluocinolone Acetonide Scalp 0.01 % OIL, Apply topically at bedtime as needed., Disp: , Rfl:    ibuprofen (ADVIL,MOTRIN) 600 MG tablet, Take 1 tablet (600 mg total) by mouth every 6 (six) hours., Disp: 30 tablet, Rfl: 0   pantoprazole (PROTONIX) 20 MG tablet, Take 1 tablet (20 mg total) by mouth daily., Disp: 30 tablet, Rfl: 0   Quercetin 500 MG CAPS, Take 1 capsule by mouth daily., Disp: , Rfl:    spironolactone (ALDACTONE) 100 MG tablet, Take 1 tablet by mouth daily at 12 noon., Disp: , Rfl:    Zinc 30 MG CAPS, Take 30 mg by mouth daily.,  Disp: , Rfl:   No orders of the defined types were placed in this encounter.   There are no Patient Instructions on file for this visit.   --Continue cardiac medications as reconciled in final medication list. --Return if symptoms worsen or fail to improve. Or sooner if needed. --Continue follow-up with your primary care physician regarding the management of your other chronic comorbid conditions.  Patient's questions and concerns were addressed to her satisfaction. She voices understanding of the instructions provided during this encounter.  This note was created using a voice recognition software as a result there may be grammatical errors inadvertently enclosed that do not reflect the nature of this encounter. Every attempt is made to correct such errors.  Rex Kras, [place], [facility]  Pager: [phone] Office: [phone]

## 2023-06-13 ENCOUNTER — Other Ambulatory Visit (HOSPITAL_BASED_OUTPATIENT_CLINIC_OR_DEPARTMENT_OTHER): Payer: Self-pay | Admitting: Family Medicine

## 2023-06-13 DIAGNOSIS — E785 Hyperlipidemia, unspecified: Secondary | ICD-10-CM

## 2023-06-17 ENCOUNTER — Ambulatory Visit (INDEPENDENT_AMBULATORY_CARE_PROVIDER_SITE_OTHER): Payer: Self-pay

## 2023-06-17 DIAGNOSIS — E785 Hyperlipidemia, unspecified: Secondary | ICD-10-CM

## 2023-06-21 ENCOUNTER — Other Ambulatory Visit: Payer: No Typology Code available for payment source
